# Patient Record
Sex: Female | Born: 1965 | Race: White | Hispanic: No | Marital: Married | Smoking: Never smoker
Health system: Southern US, Community
[De-identification: ages and names within clinical notes are randomized; demographics above are authoritative.]

## PROBLEM LIST (undated history)

## (undated) DIAGNOSIS — R42 Dizziness and giddiness: Secondary | ICD-10-CM

## (undated) DIAGNOSIS — I10 Essential (primary) hypertension: Secondary | ICD-10-CM

## (undated) DIAGNOSIS — K219 Gastro-esophageal reflux disease without esophagitis: Secondary | ICD-10-CM

## (undated) DIAGNOSIS — Z8489 Family history of other specified conditions: Secondary | ICD-10-CM

## (undated) HISTORY — PX: ABDOMINAL HYSTERECTOMY: SHX81

## (undated) HISTORY — PX: BILATERAL OOPHORECTOMY: SHX1221

---

## 2003-12-21 ENCOUNTER — Inpatient Hospital Stay: Payer: Self-pay | Admitting: Obstetrics and Gynecology

## 2004-02-18 ENCOUNTER — Ambulatory Visit: Payer: Self-pay

## 2005-01-30 ENCOUNTER — Ambulatory Visit: Payer: Self-pay | Admitting: Cardiology

## 2006-11-05 ENCOUNTER — Ambulatory Visit: Payer: Self-pay

## 2008-02-29 ENCOUNTER — Ambulatory Visit: Payer: Self-pay | Admitting: Family Medicine

## 2008-03-19 ENCOUNTER — Ambulatory Visit: Payer: Self-pay

## 2009-04-13 ENCOUNTER — Ambulatory Visit: Payer: Self-pay | Admitting: Obstetrics and Gynecology

## 2009-04-19 ENCOUNTER — Ambulatory Visit: Payer: Self-pay | Admitting: Obstetrics and Gynecology

## 2009-05-31 ENCOUNTER — Ambulatory Visit: Payer: Self-pay | Admitting: Obstetrics and Gynecology

## 2009-12-22 ENCOUNTER — Emergency Department: Payer: Self-pay | Admitting: Emergency Medicine

## 2009-12-23 ENCOUNTER — Emergency Department: Payer: Self-pay | Admitting: Emergency Medicine

## 2009-12-26 ENCOUNTER — Emergency Department: Payer: Self-pay | Admitting: Emergency Medicine

## 2009-12-29 ENCOUNTER — Ambulatory Visit: Payer: Self-pay | Admitting: Family Medicine

## 2010-10-12 ENCOUNTER — Ambulatory Visit: Payer: Self-pay | Admitting: Obstetrics and Gynecology

## 2010-10-26 ENCOUNTER — Ambulatory Visit: Payer: Self-pay | Admitting: Obstetrics and Gynecology

## 2011-10-13 ENCOUNTER — Ambulatory Visit: Payer: Self-pay | Admitting: Obstetrics and Gynecology

## 2012-01-12 ENCOUNTER — Emergency Department: Payer: Self-pay | Admitting: Emergency Medicine

## 2012-01-12 LAB — URINALYSIS, COMPLETE
Blood: NEGATIVE
Glucose,UR: NEGATIVE mg/dL (ref 0–75)
Ketone: NEGATIVE
Ph: 6 (ref 4.5–8.0)
Protein: NEGATIVE
RBC,UR: <1 /HPF (ref 0–5)
Specific Gravity: 1.021 (ref 1.003–1.030)
Squamous Epithelial: 1
WBC UR: <1 /HPF (ref 0–5)

## 2012-01-12 LAB — WET PREP, GENITAL

## 2012-01-22 ENCOUNTER — Ambulatory Visit: Payer: Self-pay | Admitting: Obstetrics and Gynecology

## 2012-02-16 ENCOUNTER — Ambulatory Visit: Payer: Self-pay | Admitting: Internal Medicine

## 2012-07-22 ENCOUNTER — Emergency Department: Payer: Self-pay | Admitting: Emergency Medicine

## 2012-07-22 LAB — BASIC METABOLIC PANEL
Anion Gap: 4 — ABNORMAL LOW (ref 7–16)
Calcium, Total: 9.3 mg/dL (ref 8.5–10.1)
Co2: 28 mmol/L (ref 21–32)
EGFR (Non-African Amer.): >60
Glucose: 89 mg/dL (ref 65–99)
Osmolality: 277 (ref 275–301)
Potassium: 3.6 mmol/L (ref 3.5–5.1)

## 2012-07-22 LAB — TROPONIN I
Troponin-I: <0.02 ng/mL
Troponin-I: <0.02 ng/mL

## 2012-07-22 LAB — CBC
HCT: 43.1 % (ref 35.0–47.0)
MCH: 30.3 pg (ref 26.0–34.0)
MCHC: 34.5 g/dL (ref 32.0–36.0)
MCV: 88 fL (ref 80–100)
RBC: 4.91 10*6/uL (ref 3.80–5.20)
RDW: 13.4 % (ref 11.5–14.5)
WBC: 9.7 10*3/uL (ref 3.6–11.0)

## 2012-07-22 LAB — CK TOTAL AND CKMB (NOT AT ARMC)
CK, Total: 66 U/L (ref 21–215)
CK-MB: <0.5 ng/mL — ABNORMAL LOW (ref 0.5–3.6)

## 2012-11-06 ENCOUNTER — Ambulatory Visit: Payer: Self-pay | Admitting: Physician Assistant

## 2013-02-21 ENCOUNTER — Ambulatory Visit: Payer: Self-pay | Admitting: Obstetrics and Gynecology

## 2013-05-06 ENCOUNTER — Ambulatory Visit: Payer: Self-pay | Admitting: Emergency Medicine

## 2013-05-06 LAB — URINALYSIS, COMPLETE
BILIRUBIN, UR: NEGATIVE
Glucose,UR: NEGATIVE mg/dL (ref 0–75)
Ketone: NEGATIVE
Nitrite: POSITIVE
Ph: 6.5 (ref 4.5–8.0)
Protein: 30
Specific Gravity: 1.015 (ref 1.003–1.030)
Squamous Epithelial: NONE SEEN

## 2013-05-09 LAB — URINE CULTURE

## 2013-06-18 ENCOUNTER — Ambulatory Visit: Payer: Self-pay | Admitting: Obstetrics and Gynecology

## 2013-06-18 LAB — CREATININE, SERUM
Creatinine: 0.93 mg/dL (ref 0.60–1.30)
EGFR (African American): >60
EGFR (Non-African Amer.): >60

## 2013-09-29 ENCOUNTER — Ambulatory Visit: Payer: Self-pay | Admitting: Gastroenterology

## 2013-09-30 LAB — PATHOLOGY REPORT

## 2014-04-28 ENCOUNTER — Ambulatory Visit: Payer: Self-pay | Admitting: Obstetrics and Gynecology

## 2014-07-01 IMAGING — CT CT ABDOMEN AND PELVIS WITHOUT AND WITH CONTRAST
2 of 10 series · 11 of 46 positions shown, 18 images · IV contrast (isovue)
Comparison: 01/22/2012

CLINICAL DATA: Painless hematuria. History urinary tract
infections. Dysuria. Partial hysterectomy.

EXAM:
CT ABDOMEN AND PELVIS WITHOUT AND WITH CONTRAST
TECHNIQUE: Multidetector CT imaging of the abdomen and pelvis was performed
without contrast material in one or both body regions, followed by
contrast material(s) and further sections in one or both body
regions.
CONTRAST:  125 cc Isovue 370

[Series 8: hematuria > 45 10 min delay · axial · delayed · 0.69mm/px · z∈[-902,-472]mm · 9 of 108 slices shown, 15 images]
[im 11/108  soft-tissue]
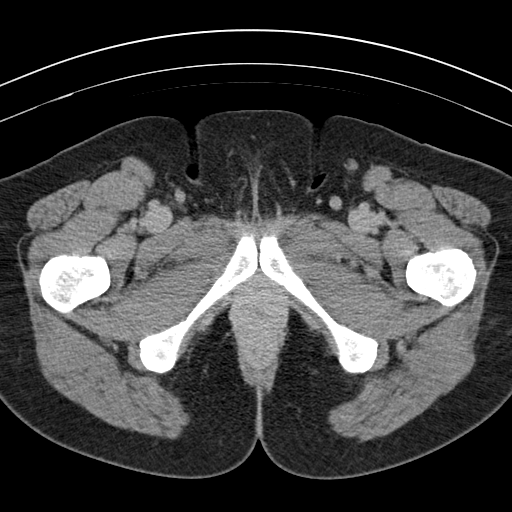
[im 11/108  bone]
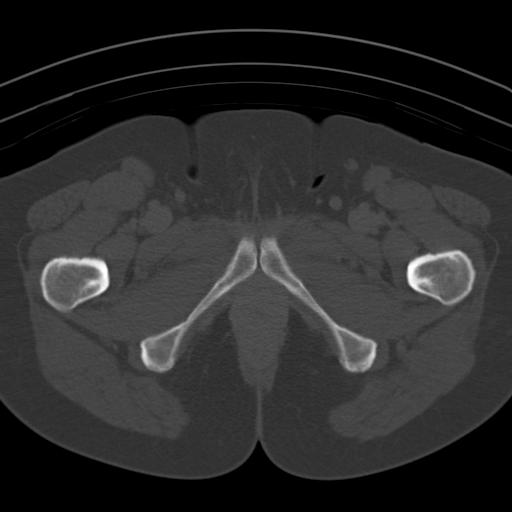
[im 22/108  soft-tissue]
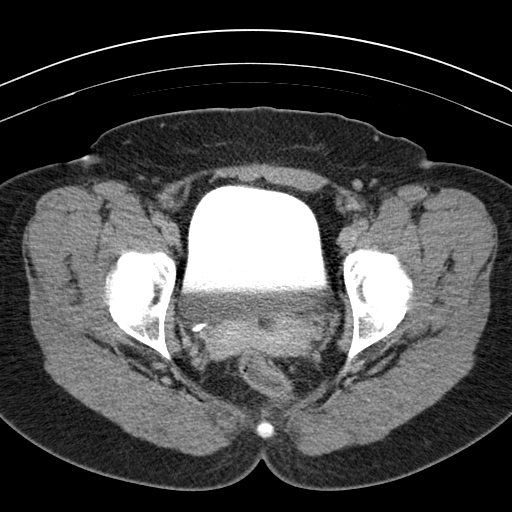
[im 33/108  soft-tissue]
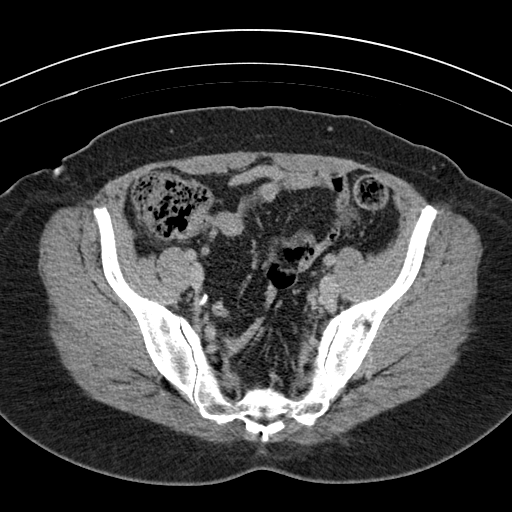
[im 43/108  soft-tissue]
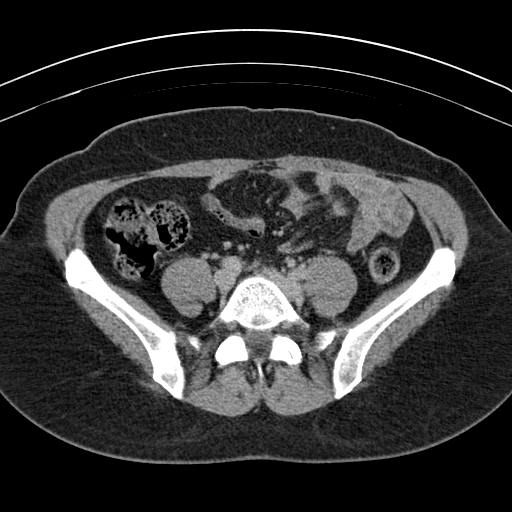
[im 54/108  soft-tissue]
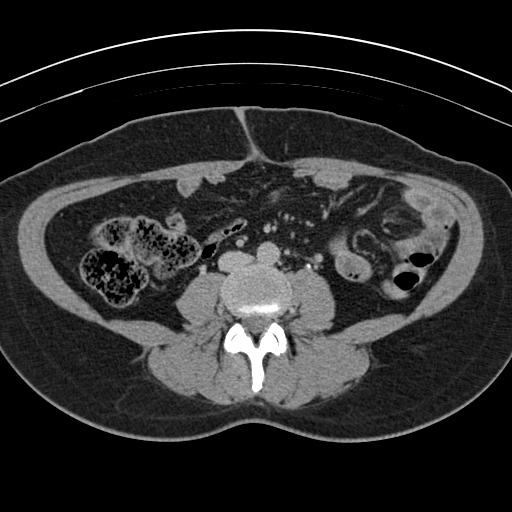
[im 65/108  soft-tissue]
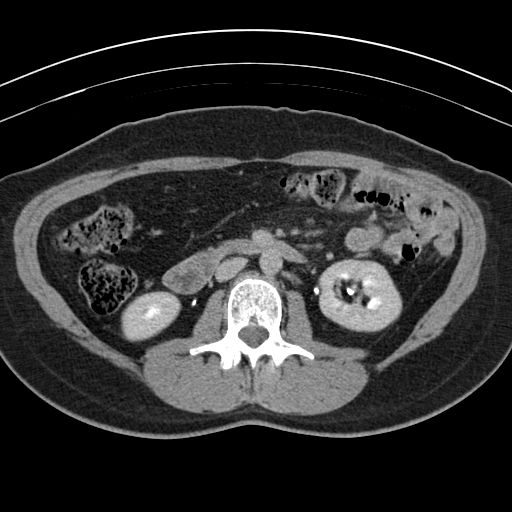
[im 65/108  lung]
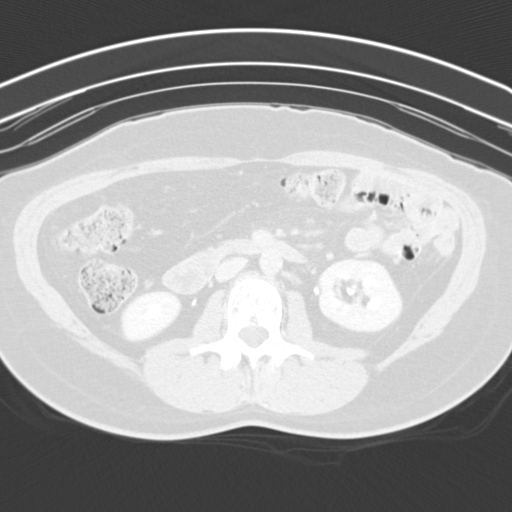
[im 75/108  soft-tissue]
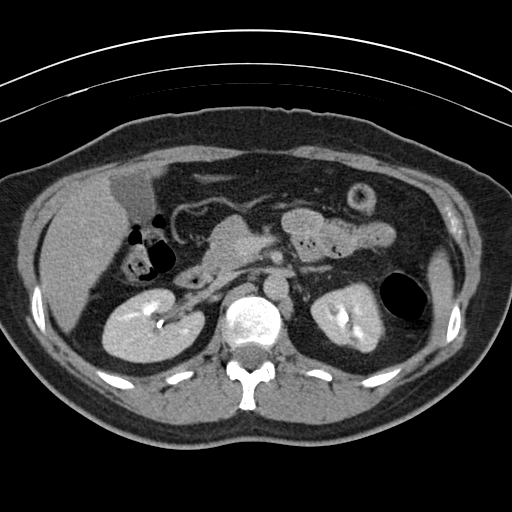
[im 75/108  lung]
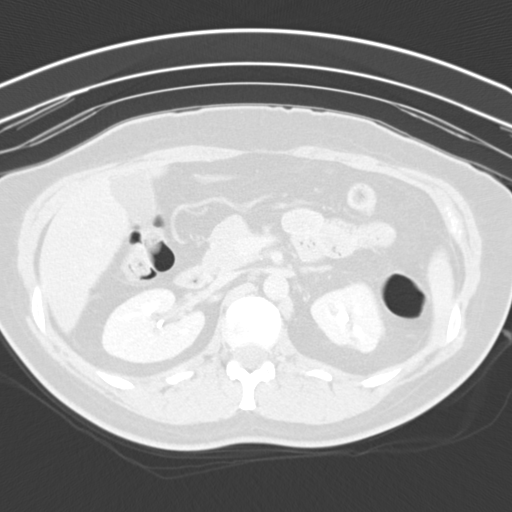
[im 86/108  soft-tissue]
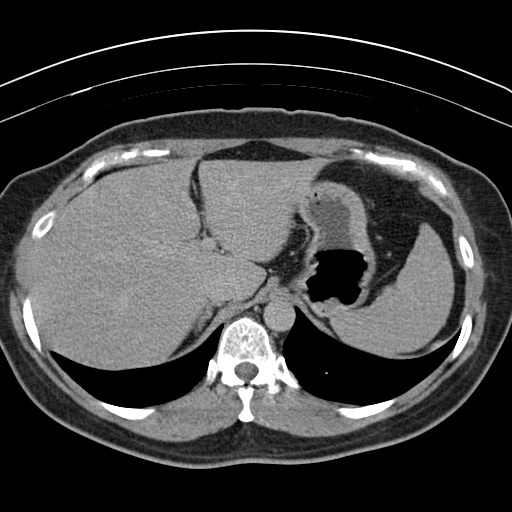
[im 86/108  lung]
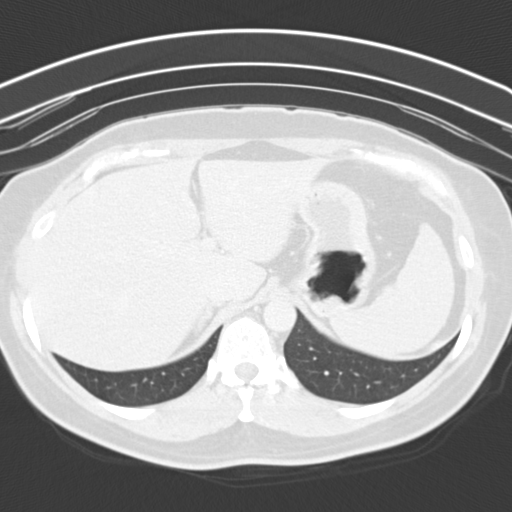
[im 97/108  soft-tissue]
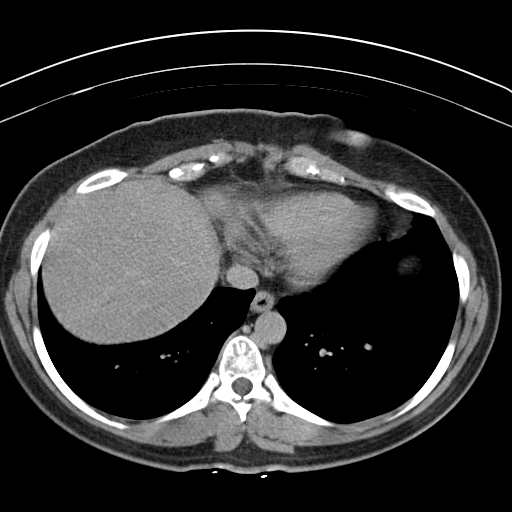
[im 97/108  lung]
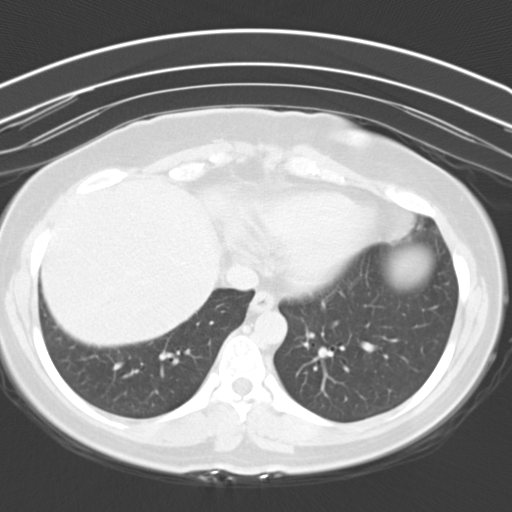
[im 97/108  bone]
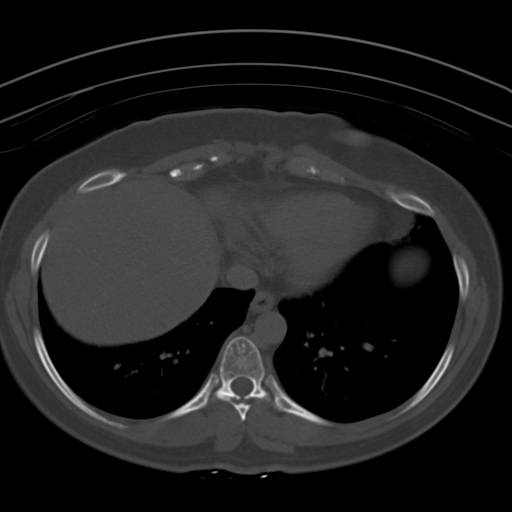

[Series 603: pre cor · coronal · non-contrast · 0.94mm/px · 2 of 100 slices shown, 3 images]
[im 34/100  soft-tissue]
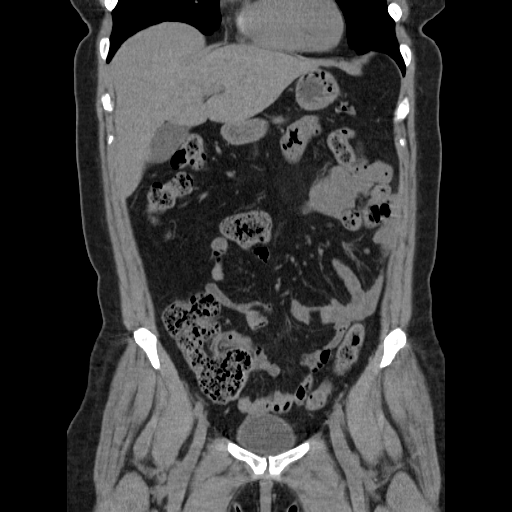
[im 34/100  bone]
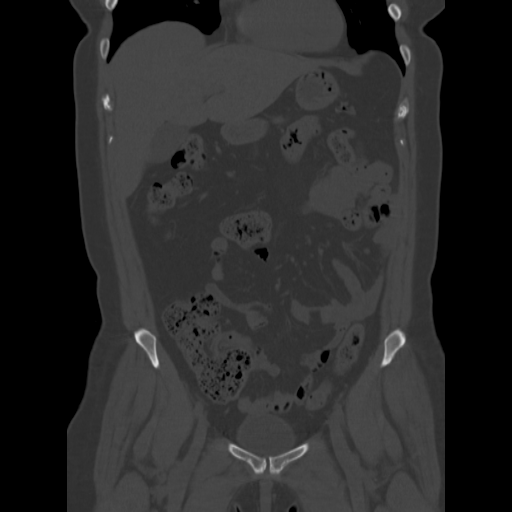
[im 67/100  soft-tissue]
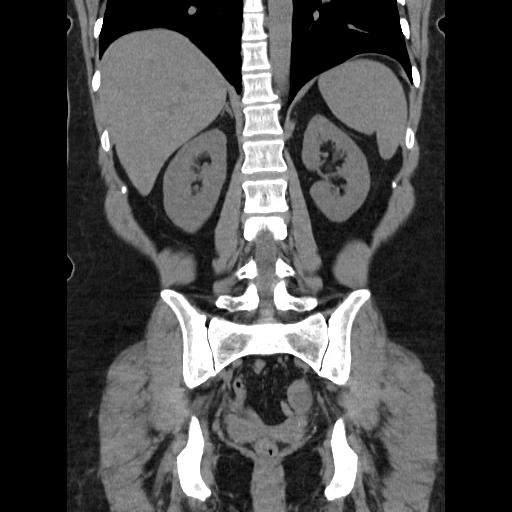

[11 of 46 positions shown; findings below may reference images not displayed]

FINDINGS: Lung Bases: Clear lung bases. Normal heart size without pericardial
or pleural effusion.

Kidneys/Ureters/Bladder: No renal calculi or hydronephrosis.
Phleboliths in the pelvis, but no hydroureter or ureteric stone.

No renal mass on post-contrast images. Mild volume loss and
hypoenhancement in the upper pole left kidney. Example image
25/series 5. Similar. Moderate renal collecting system opacification
on delayed images. Portions of the mid right and mid to distal left
ureters are incompletely opacified. No filling defects within the
opacified portions and no enhancing lesions throughout the ureters.
No enhancing bladder mass or filling defect on moderately well
opacified delayed images.

Other: Normal liver. A tiny splenule. Normal stomach, pancreas,
gallbladder, biliary tract, adrenal glands. Small stable
retroperitoneal nodes, likely reactive. Normal colon, appendix, and
terminal ileum. Normal small bowel without abdominal ascites. No
pelvic adenopathy. Hysterectomy. Left ovarian dominant follicle of
3.0 cm. Right ovary not visualized; no adnexal mass. No significant
free fluid.

Bones/Musculoskeletal: Vague sclerosis in the right iliac is
unchanged, benign but nonspecific.
IMPRESSION: No acute process or explanation for hematuria. Mild scarring in the
upper pole left kidney.

## 2014-07-10 ENCOUNTER — Emergency Department
Admission: EM | Admit: 2014-07-10 | Discharge: 2014-07-10 | Disposition: A | Discharge status: Home / Self Care | Payer: BLUE CROSS/BLUE SHIELD | Attending: Emergency Medicine | Admitting: Emergency Medicine

## 2014-07-10 ENCOUNTER — Encounter: Payer: Self-pay | Admitting: Emergency Medicine

## 2014-07-10 ENCOUNTER — Other Ambulatory Visit: Payer: Self-pay

## 2014-07-10 DIAGNOSIS — R42 Dizziness and giddiness: Secondary | ICD-10-CM | POA: Diagnosis not present

## 2014-07-10 DIAGNOSIS — R5383 Other fatigue: Secondary | ICD-10-CM | POA: Insufficient documentation

## 2014-07-10 HISTORY — DX: Dizziness and giddiness: R42

## 2014-07-10 LAB — BASIC METABOLIC PANEL
Anion gap: 8 (ref 5–15)
BUN: 17 mg/dL (ref 6–20)
CHLORIDE: 101 mmol/L (ref 101–111)
CO2: 31 mmol/L (ref 22–32)
Calcium: 9.4 mg/dL (ref 8.9–10.3)
Creatinine, Ser: 0.99 mg/dL (ref 0.44–1.00)
GFR calc Af Amer: >60 mL/min (ref >60)
GFR calc non Af Amer: >60 mL/min (ref >60)
GLUCOSE: 112 mg/dL — AB (ref 65–99)
Potassium: 3.9 mmol/L (ref 3.5–5.1)
SODIUM: 140 mmol/L (ref 135–145)

## 2014-07-10 LAB — URINALYSIS COMPLETE WITH MICROSCOPIC (ARMC ONLY)
BACTERIA UA: NONE SEEN
Bilirubin Urine: NEGATIVE
GLUCOSE, UA: NEGATIVE mg/dL
Hgb urine dipstick: NEGATIVE
Ketones, ur: NEGATIVE mg/dL
LEUKOCYTES UA: NEGATIVE
NITRITE: NEGATIVE
Protein, ur: NEGATIVE mg/dL
RBC / HPF: NONE SEEN RBC/hpf (ref 0–5)
Specific Gravity, Urine: 1.012 (ref 1.005–1.030)
pH: 8 (ref 5.0–8.0)

## 2014-07-10 LAB — TROPONIN I

## 2014-07-10 LAB — CBC
HEMATOCRIT: 42.9 % (ref 35.0–47.0)
Hemoglobin: 14.3 g/dL (ref 12.0–16.0)
MCH: 29.3 pg (ref 26.0–34.0)
MCHC: 33.2 g/dL (ref 32.0–36.0)
MCV: 88.3 fL (ref 80.0–100.0)
Platelets: 262 10*3/uL (ref 150–440)
RBC: 4.86 MIL/uL (ref 3.80–5.20)
RDW: 13 % (ref 11.5–14.5)
WBC: 6.9 10*3/uL (ref 3.6–11.0)

## 2014-07-10 MED ORDER — MECLIZINE HCL 25 MG PO TABS
25.0000 mg | ORAL_TABLET | Freq: Once | ORAL | Status: AC
  Administered 2014-07-10: 25 mg via ORAL

## 2014-07-10 MED ORDER — MECLIZINE HCL 25 MG PO TABS
ORAL_TABLET | ORAL | Status: AC
  Administered 2014-07-10: 25 mg via ORAL
  Filled 2014-07-10: qty 1

## 2014-07-10 MED ORDER — MECLIZINE HCL 12.5 MG PO TABS: 12.5000 mg | ORAL_TABLET | Freq: Three times a day (TID) | ORAL | Status: AC | PRN

## 2014-07-10 NOTE — ED Notes (Signed)
Patient to ED with c/o feeling like her vertigo is causing her dizziness since yesterday evening. Patient reports however that this time she has noted herself to be more weak over all and feeling fatigued. Patient also reports that over the last couple of months she feels like she is having more trouble getting her thoughts out into words. Reports normally she can take a Valium and will go to bed and wake up and it's better. Patient c/o nausea when lying down for EKG.

## 2014-07-10 NOTE — ED Notes (Signed)
Pt in no distress, advised pt of wait, pt verbalized understanding, lab results reviewed

## 2014-07-10 NOTE — ED Notes (Signed)
Pt arrives with complaints of dizziness, vertigo, and equilibrium off, pt states she feels slow to catch on to conversations, pt states she feels like she is in a fog, pt states symptoms for about a week, pt awake and alert during assessment, answering appropriate questions

## 2014-07-10 NOTE — Discharge Instructions (Signed)
Take meclizine as needed. Follow-up with your doctor at [facility] clinic or with neurology as discussed. Return to the emergency department if you feel worse or you have other urgent concerns.  Benign Positional Vertigo Vertigo means you feel like you or your surroundings are moving when they are not. Benign positional vertigo is the most common form of vertigo. Benign means that the cause of your condition is not serious. Benign positional vertigo is more common in older adults. CAUSES  Benign positional vertigo is the result of an upset in the labyrinth system. This is an area in the middle ear that helps control your balance. This may be caused by a viral infection, head injury, or repetitive motion. However, often no specific cause is found. SYMPTOMS  Symptoms of benign positional vertigo occur when you move your head or eyes in different directions. Some of the symptoms may include:  Loss of balance and falls.  Vomiting.  Blurred vision.  Dizziness.  Nausea.  Involuntary eye movements (nystagmus). DIAGNOSIS  Benign positional vertigo is usually diagnosed by physical exam. If the specific cause of your benign positional vertigo is unknown, your caregiver may perform imaging tests, such as magnetic resonance imaging (MRI) or computed tomography (CT). TREATMENT  Your caregiver may recommend movements or procedures to correct the benign positional vertigo. Medicines such as meclizine, benzodiazepines, and medicines for nausea may be used to treat your symptoms. In rare cases, if your symptoms are caused by certain conditions that affect the inner ear, you may need surgery. HOME CARE INSTRUCTIONS   Follow your caregiver's instructions.  Move slowly. Do not make sudden body or head movements.  Avoid driving.  Avoid operating heavy machinery.  Avoid performing any tasks that would be dangerous to you or others during a vertigo episode.  Drink enough fluids to keep your urine clear or  pale yellow. SEEK IMMEDIATE MEDICAL CARE IF:   You develop problems with walking, weakness, numbness, or using your arms, hands, or legs.  You have difficulty speaking.  You develop severe headaches.  Your nausea or vomiting continues or gets worse.  You develop visual changes.  Your family or friends notice any behavioral changes.  Your condition gets worse.  You have a fever.  You develop a stiff neck or sensitivity to light. MAKE SURE YOU:   Understand these instructions.  Will watch your condition.  Will get help right away if you are not doing well or get worse. Document Released: 11/14/2005 Document Revised: 05/01/2011 Document Reviewed: 10/27/2010 [facility] Patient Information 2015 [place], [place]. This information is not intended to replace advice given to you by your health care provider. Make sure you discuss any questions you have with your health care provider.

## 2014-07-10 NOTE — ED Provider Notes (Signed)
[facility] Emergency Department Provider Note  ____________________________________________  Time seen: 1530  I have reviewed the triage vital signs and the nursing notes.   HISTORY  Chief Complaint Dizziness and Fatigue     HPI Kelsey Stephenson is a 49 y.o. female who has a history of vertigo. She reports that she gets this 2-3 times a year. She began to have symptoms of vertigo last night. It continued today, although it is much better now. She reports that she was more concerned because she feels like she has been in a "fog" for the past week. She feels as though she misses words within conversations. She does not feel like her usual self. She does report a mild headache. She has no chest pain, shortness of breath, abdominal pain, nausea or vomiting.   Past Medical History  Diagnosis Date  . Vertigo     There are no active problems to display for this patient.   History reviewed. No pertinent past surgical history.  Current Outpatient Rx  Name  Route  Sig  Dispense  Refill  . meclizine (ANTIVERT) 12.5 MG tablet   Oral   Take 1 tablet (12.5 mg total) by mouth 3 (three) times daily as needed for dizziness or nausea.   20 tablet   1     Allergies Review of patient's allergies indicates no known allergies.  History reviewed. No pertinent family history.  Social History History  Substance Use Topics  . Smoking status: Never Smoker   . Smokeless tobacco: Never Used  . Alcohol Use: No    Review of Systems  Constitutional: Negative for fever. ENT: Negative for sore throat. Cardiovascular: Negative for chest pain. Respiratory: Negative for shortness of breath. Gastrointestinal: Negative for abdominal pain, vomiting and diarrhea. Genitourinary: Negative for dysuria. Musculoskeletal: Negative for back pain. Skin: Negative for rash. Neurological: Positive for a history of vertigo and recent vertigo as well as for a mild headache. See  history of present illness.   10-point ROS otherwise negative.  ____________________________________________   PHYSICAL EXAM:  VITAL SIGNS: ED Triage Vitals  Enc Vitals Group     BP 07/10/14 1248 129/88 mmHg     Pulse Rate 07/10/14 1248 82     Resp 07/10/14 1248 18     Temp 07/10/14 1248 98.7 F (37.1 C)     Temp src --      SpO2 07/10/14 1248 99 %     Weight 07/10/14 1248 178 lb (80.74 kg)     Height 07/10/14 1248 5\' 7"  (1.702 m)     Head Cir --      Peak Flow --      Pain Score --      Pain Loc --      Pain Edu? --      Excl. in [place]? --    Constitutional: Alert and oriented. Well appearing and in no distress. ENT   Head: Normocephalic and atraumatic.   Nose: No congestion/rhinnorhea.   Mouth/Throat: Mucous membranes are moist.      Ears: Normal to exam. No erythema. Normal canals. No bulging of the TMs. No pain on exam. Cardiovascular: Normal rate, regular rhythm. Respiratory: Normal respiratory effort without tachypnea. Breath sounds are clear and equal bilaterally. No wheezes/rales/rhonchi. Gastrointestinal: Soft and nontender. No distention.  Back: There is no CVA tenderness. Musculoskeletal: Nontender with normal range of motion in all extremities.  No noted edema. Neurologic: No focal neurologic deficits. She is ambulatory without abnormalities. She has  5 over 5 strength in all 4 extremities. She has a negative Romberg, negative pronator drift, negative finger to nose. Cranial nerves II through XII are intact..  Skin:  Skin is warm, dry. No rash noted. Psychiatric: Mood and affect are normal. Speech and behavior are normal.  ____________________________________________    LABS (pertinent positives/negatives)  Blood tests are overall unremarkable with a normal blood count, normal metabolic panel, and a negative troponin level. A UA is pending  ----------------------------------------- 4:55 PM on  07/10/2014 -----------------------------------------  Urinalysis is within normal limits with no sign of infection.  ____________________________________________   EKG  I have interpreted and reviewed the EKG myself. It shows normal sinus rhythm at a rate of 79 with a normal access, normal intervals, and no ST-T segment changes. This EKG was done at 12:52 PM.  ____________________________________________   ________________ INITIAL IMPRESSION / ASSESSMENT AND PLAN / ED COURSE  This patient is alert communicative and mentating properly. She has symptoms of vertigo that she has had before. I have discussed the pros and cons of doing a CT scan for this patient who has a mild headache and prior episodes of vertigo. She agrees that a CT is not appropriate today. I believe she needs follow-up with her primary physician or with a neurologist. Given the long-term nature of this problem and MRI after outpatient neurologic consult would not be unreasonable.  We will treat her with meclizine.  ____________________________________________   FINAL CLINICAL IMPRESSION(S) / ED DIAGNOSES  Final diagnoses:  Vertigo      Ahmed Prima, MD 07/10/14 [phone]

## 2015-09-28 ENCOUNTER — Emergency Department
Admission: EM | Admit: 2015-09-28 | Discharge: 2015-09-28 | Disposition: A | Discharge status: Home / Self Care | Payer: BLUE CROSS/BLUE SHIELD | Attending: Emergency Medicine | Admitting: Emergency Medicine

## 2015-09-28 ENCOUNTER — Encounter: Payer: Self-pay | Admitting: Emergency Medicine

## 2015-09-28 DIAGNOSIS — R55 Syncope and collapse: Secondary | ICD-10-CM | POA: Insufficient documentation

## 2015-09-28 DIAGNOSIS — F410 Panic disorder [episodic paroxysmal anxiety] without agoraphobia: Secondary | ICD-10-CM | POA: Diagnosis not present

## 2015-09-28 LAB — URINALYSIS COMPLETE WITH MICROSCOPIC (ARMC ONLY)
BACTERIA UA: NONE SEEN
Bilirubin Urine: NEGATIVE
Glucose, UA: NEGATIVE mg/dL
Hgb urine dipstick: NEGATIVE
Ketones, ur: NEGATIVE mg/dL
LEUKOCYTES UA: NEGATIVE
Nitrite: NEGATIVE
PROTEIN: NEGATIVE mg/dL
Specific Gravity, Urine: 1.01 (ref 1.005–1.030)
pH: 6 (ref 5.0–8.0)

## 2015-09-28 LAB — BASIC METABOLIC PANEL
Anion gap: 11 (ref 5–15)
BUN: 25 mg/dL — ABNORMAL HIGH (ref 6–20)
CHLORIDE: 103 mmol/L (ref 101–111)
CO2: 26 mmol/L (ref 22–32)
CREATININE: 0.95 mg/dL (ref 0.44–1.00)
Calcium: 9.3 mg/dL (ref 8.9–10.3)
GFR calc Af Amer: >60 mL/min (ref >60)
GFR calc non Af Amer: >60 mL/min (ref >60)
Glucose, Bld: 110 mg/dL — ABNORMAL HIGH (ref 65–99)
POTASSIUM: 3.9 mmol/L (ref 3.5–5.1)
SODIUM: 140 mmol/L (ref 135–145)

## 2015-09-28 LAB — CBC
HEMATOCRIT: 40.6 % (ref 35.0–47.0)
Hemoglobin: 14.3 g/dL (ref 12.0–16.0)
MCH: 30.1 pg (ref 26.0–34.0)
MCHC: 35.1 g/dL (ref 32.0–36.0)
MCV: 85.7 fL (ref 80.0–100.0)
Platelets: 271 10*3/uL (ref 150–440)
RBC: 4.74 MIL/uL (ref 3.80–5.20)
RDW: 13.5 % (ref 11.5–14.5)
WBC: 8.9 10*3/uL (ref 3.6–11.0)

## 2015-09-28 LAB — TROPONIN I: Troponin I: <0.03 ng/mL (ref <0.03)

## 2015-09-28 NOTE — ED Provider Notes (Signed)
[facility] Emergency Department Provider Note  ____________________________________________   First MD Initiated Contact with Patient 09/28/15 [phone]     (approximate)  I have reviewed the triage vital signs and the nursing notes.   HISTORY  Chief Complaint Near Syncope    HPI Kelsey Stephenson is a 50 y.o. female with a past medical history that includes vertigo and panic attacks although panic attacks are rare.  She presents tonight for evaluation ofa constellation of symptoms that she felt before trying to go to sleep tonight that included tingling, feeling like she was given a pass out, some rapid breathing and possibly palpitations.  She states that it does feel similar to prior panic attacks but what was different this time was that she took her blood pressure during the episode and found her blood pressure significantly elevated in the 180/110 range.  She repeated the blood pressure measurement several times and it was always elevated so she felt like she should be checked out in the emergency department.  She states that the symptoms started rapidly, were moderate to severe in intensity, and completely resolved without intervention.  She did take a Xanax at home but it did not immediately help.  She denies fever/chills, chest pain, shortness of breath, abdominal pain, nausea, vomiting, dysuria.  She does not have a diagnosis of hypertension, hypercholesterolemia, diabetes, and she does not smoke.  She has no first-degree relatives that have any history of heart disease.   Past Medical History:  Diagnosis Date  . Vertigo     There are no active problems to display for this patient.   Past Surgical History:  Procedure Laterality Date  . ABDOMINAL HYSTERECTOMY      Prior to Admission medications   Not on File    Allergies Review of patient's allergies indicates no known allergies.  No family history on file.  Social History Social History    Substance Use Topics  . Smoking status: Never Smoker  . Smokeless tobacco: Never Used  . Alcohol use No    Review of Systems Constitutional: No fever/chills Eyes: No visual changes. ENT: No sore throat. Cardiovascular: Denies chest pain. +palpitations and "tingling" of her chest Respiratory: Denies shortness of breath. Gastrointestinal: No abdominal pain.  No nausea, no vomiting.  No diarrhea.  No constipation. Genitourinary: Negative for dysuria. Musculoskeletal: Negative for back pain. Skin: Negative for rash. Neurological: Negative for headaches, focal weakness or numbness except for some tingling in her fingers during her episode.  Near syncope  10-point ROS otherwise negative.  ____________________________________________   PHYSICAL EXAM:  VITAL SIGNS: ED Triage Vitals  Enc Vitals Group     BP 09/28/15 0142 (!) 147/88     Pulse Rate 09/28/15 0142 88     Resp 09/28/15 0142 20     Temp 09/28/15 0142 98 F (36.7 C)     Temp Source 09/28/15 0142 Oral     SpO2 09/28/15 0142 99 %     Weight 09/28/15 0142 190 lb (86.2 kg)     Height 09/28/15 0142 5\' 7"  (1.702 m)     Head Circumference --      Peak Flow --      Pain Score 09/28/15 0446 0     Pain Loc --      Pain Edu? --      Excl. in [place]? --     Constitutional: Alert and oriented. Well appearing and in no acute distress. Eyes: Conjunctivae are normal. PERRL. EOMI.  Head: Atraumatic. Nose: No congestion/rhinnorhea. Mouth/Throat: Mucous membranes are moist.  Oropharynx non-erythematous. Neck: No stridor.  No meningeal signs.   Cardiovascular: Normal rate, regular rhythm. Good peripheral circulation. Grossly normal heart sounds.   Respiratory: Normal respiratory effort.  No retractions. Lungs CTAB. Gastrointestinal: Soft and nontender. No distention.  Musculoskeletal: No lower extremity tenderness nor edema. No gross deformities of extremities. Neurologic:  Normal speech and language. No gross focal neurologic  deficits are appreciated.  Skin:  Skin is warm, dry and intact. No rash noted. Psychiatric: Mood and affect are normal. Speech and behavior are normal.  ____________________________________________   LABS (all labs ordered are listed, but only abnormal results are displayed)  Labs Reviewed  BASIC METABOLIC PANEL - Abnormal; Notable for the following:       Result Value   Glucose, Bld 110 (*)    BUN 25 (*)    All other components within normal limits  URINALYSIS COMPLETEWITH MICROSCOPIC ([facility] ONLY) - Abnormal; Notable for the following:    Color, Urine STRAW (*)    APPearance CLEAR (*)    Squamous Epithelial / LPF 0-5 (*)    All other components within normal limits  CBC  TROPONIN I   ____________________________________________  EKG  ED ECG REPORT I, Adrieana Fennelly, the attending physician, personally viewed and interpreted this ECG.  Date: 09/28/2015 EKG Time: 01:49 Rate: 89 Rhythm: normal sinus rhythm QRS Axis: normal Intervals: normal ST/T Wave abnormalities: normal Conduction Disturbances: none Narrative Interpretation: unremarkable  ____________________________________________  RADIOLOGY   No results found.  ____________________________________________   PROCEDURES  Procedure(s) performed:   Procedures   Critical Care performed: No ____________________________________________   INITIAL IMPRESSION / ASSESSMENT AND PLAN / ED COURSE  Pertinent labs & imaging results that were available during my care of the patient were reviewed by me and considered in my medical decision making (see chart for details).  Well-appearing and in no acute distress with normal vitals and normal lab workup.      Pulmonary Embolism Rule-out Criteria (PERC rule)                        If YES to ANY of the following, the PERC rule is not satisfied and cannot be used to rule out PE in this patient (consider d-dimer or imaging depending on pre-test probability).                       If NO to ALL of the following, AND the clinician's pre-test probability is <15%, the [facility] rule is satisfied and there is no need for further workup (including no need to obtain a d-dimer) as the post-test probability of pulmonary embolism is <2%.                      Mnemonic is HAD CLOTS   H - hormone use (exogenous estrogen)      No. A - age > 86                                                 No. D - DVT/PE history  No.   C - coughing blood (hemoptysis)                 No. L - leg swelling, unilateral                             No. O - O2 Sat on Room Air < 95%                  No. T - tachycardia (HR ? 100)                         No. S - surgery or trauma, recent                      No.   Based on my evaluation of the patient, including application of this decision instrument, further testing to evaluate for pulmonary embolism is not indicated at this time.   HEART score is 1.  Suspect panic attack, now resolved.  I had my usual and customary discussion with the patient and her husband and gave my usual return precautions.  She will follow up as an outpatient with her primary care doctor.    ____________________________________________  FINAL CLINICAL IMPRESSION(S) / ED DIAGNOSES  Final diagnoses:  Near syncope  Panic attack     MEDICATIONS GIVEN DURING THIS VISIT:  Medications - No data to display   NEW OUTPATIENT MEDICATIONS STARTED DURING THIS VISIT:  New Prescriptions   No medications on file      Note:  This document was prepared using Dragon voice recognition software and may include unintentional dictation errors.    Hinda Kehr, MD 09/28/15 [phone]

## 2015-09-28 NOTE — ED Notes (Signed)
Pt states her BP was elevated when her s/x's initially started prior to being transported to the ED via [facility]. Pt reports BP of 188/110 at home when she felt the tingling sensations and near-syncopal episode that caused her to come to the ED for evaluation. Pt denies any shortness of breath or nausea, but (+) for "indigestion". Pt states all presenting s/x's have since subsided.

## 2015-09-28 NOTE — ED Triage Notes (Addendum)
Pt to triage via w/c with no distress noted, brought in by EMS; pt reports sudden onset tingling sensation to chest, felt "like going to pass out"; took xanax for possible anxiety with no relief; denies any c/o at present

## 2015-09-28 NOTE — Discharge Instructions (Signed)
As we discussed, your workup today was reassuring.  Though we do not know exactly what is causing your symptoms, it appears that you have no emergent medical condition at this time are safe to go home and follow up as recommended in this paperwork.  We recommend you take a daily baby aspirin (81 mg) if you are not already taking one.  Please return immediately to the Emergency Department if you develop any new or worsening symptoms that concern you.

## 2015-09-28 NOTE — ED Notes (Signed)
Ems via wheelchair, anxiety related symptoms , no chest pain , BP 188/102, repeat 142/92, took own xanax PTA , no signs of distress, CBG 97 , RA 97%

## 2015-11-05 ENCOUNTER — Other Ambulatory Visit: Payer: Self-pay | Admitting: Obstetrics and Gynecology

## 2015-11-05 DIAGNOSIS — Z1231 Encounter for screening mammogram for malignant neoplasm of breast: Secondary | ICD-10-CM

## 2015-11-18 ENCOUNTER — Ambulatory Visit
Admission: RE | Admit: 2015-11-18 | Discharge: 2015-11-18 | Disposition: A | Discharge status: Home / Self Care | Payer: BLUE CROSS/BLUE SHIELD | Source: Ambulatory Visit | Attending: Obstetrics and Gynecology | Admitting: Obstetrics and Gynecology

## 2015-11-18 ENCOUNTER — Other Ambulatory Visit: Payer: Self-pay | Admitting: Obstetrics and Gynecology

## 2015-11-18 DIAGNOSIS — Z1231 Encounter for screening mammogram for malignant neoplasm of breast: Secondary | ICD-10-CM

## 2016-10-31 ENCOUNTER — Other Ambulatory Visit: Payer: Self-pay | Admitting: Podiatry

## 2016-10-31 DIAGNOSIS — R609 Edema, unspecified: Secondary | ICD-10-CM

## 2016-10-31 DIAGNOSIS — M79672 Pain in left foot: Secondary | ICD-10-CM

## 2016-11-08 ENCOUNTER — Ambulatory Visit
Admission: RE | Admit: 2016-11-08 | Discharge: 2016-11-08 | Disposition: A | Discharge status: Home / Self Care | Payer: BLUE CROSS/BLUE SHIELD | Source: Ambulatory Visit | Attending: Podiatry | Admitting: Podiatry

## 2016-11-08 DIAGNOSIS — R609 Edema, unspecified: Secondary | ICD-10-CM | POA: Diagnosis not present

## 2016-11-08 DIAGNOSIS — M19072 Primary osteoarthritis, left ankle and foot: Secondary | ICD-10-CM | POA: Insufficient documentation

## 2016-11-08 DIAGNOSIS — M79672 Pain in left foot: Secondary | ICD-10-CM | POA: Diagnosis present

## 2016-11-08 DIAGNOSIS — M7742 Metatarsalgia, left foot: Secondary | ICD-10-CM | POA: Insufficient documentation

## 2016-11-21 ENCOUNTER — Other Ambulatory Visit: Payer: Self-pay | Admitting: Podiatry

## 2016-11-27 ENCOUNTER — Other Ambulatory Visit: Payer: Self-pay | Admitting: Nurse Practitioner

## 2016-11-27 DIAGNOSIS — Z1231 Encounter for screening mammogram for malignant neoplasm of breast: Secondary | ICD-10-CM

## 2016-11-28 ENCOUNTER — Other Ambulatory Visit: Payer: Self-pay | Admitting: Neurology

## 2016-11-28 DIAGNOSIS — R42 Dizziness and giddiness: Secondary | ICD-10-CM

## 2016-11-29 ENCOUNTER — Encounter: Payer: Self-pay | Admitting: *Deleted

## 2016-12-01 ENCOUNTER — Ambulatory Visit
Admission: RE | Admit: 2016-12-01 | Discharge: 2016-12-01 | Disposition: A | Discharge status: Home / Self Care | Payer: BLUE CROSS/BLUE SHIELD | Source: Ambulatory Visit | Attending: Nurse Practitioner | Admitting: Nurse Practitioner

## 2016-12-01 DIAGNOSIS — Z1231 Encounter for screening mammogram for malignant neoplasm of breast: Secondary | ICD-10-CM | POA: Diagnosis present

## 2016-12-01 NOTE — Discharge Instructions (Signed)
[facility] °[facility] ° °POST OPERATIVE INSTRUCTIONS FOR DR. TROXLER AND DR. FOWLER °[facility] ° ° °1. Take your medication as prescribed.  Pain medication should be taken only as needed. ° °2. Keep the dressing clean, dry and intact. ° °3. Keep your foot elevated above the heart level for the first 48 hours. ° °4. Walking to the bathroom and brief periods of walking are acceptable, unless we have instructed you to be non-weight bearing. ° °5. Always wear your post-op shoe when walking.  Always use your crutches if you are to be non-weight bearing. ° °6. Do not take a shower. Baths are permissible as long as the foot is kept out of the water.  ° °7. Every hour you are awake:  °- Bend your knee 15 times. °- Flex foot 15 times °- Massage calf 15 times ° °8. Call [facility] ([phone]) if any of the following problems occur: °- You develop a temperature or fever. °- The bandage becomes saturated with blood. °- Medication does not stop your pain. °- Injury of the foot occurs. °- Any symptoms of infection including redness, odor, or red streaks running from wound. ° ° °General Anesthesia, Adult, Care After °These instructions provide you with information about caring for yourself after your procedure. Your health care provider may also give you more specific instructions. Your treatment has been planned according to current medical practices, but problems sometimes occur. Call your health care provider if you have any problems or questions after your procedure. °What can I expect after the procedure? °After the procedure, it is common to have: °· Vomiting. °· A sore throat. °· Mental slowness. ° °It is common to feel: °· Nauseous. °· Cold or shivery. °· Sleepy. °· Tired. °· Sore or achy, even in parts of your body where you did not have surgery. ° °Follow these instructions at home: °For at least 24 hours after the procedure: °· Do not: °? Participate in  activities where you could fall or become injured. °? Drive. °? Use heavy machinery. °? Drink alcohol. °? Take sleeping pills or medicines that cause drowsiness. °? Make important decisions or sign legal documents. °? Take care of children on your own. °· Rest. °Eating and drinking °· If you vomit, drink water, juice, or soup when you can drink without vomiting. °· Drink enough fluid to keep your urine clear or pale yellow. °· Make sure you have little or no nausea before eating solid foods. °· Follow the diet recommended by your health care provider. °General instructions °· Have a responsible adult stay with you until you are awake and alert. °· Return to your normal activities as told by your health care provider. Ask your health care provider what activities are safe for you. °· Take over-the-counter and prescription medicines only as told by your health care provider. °· If you smoke, do not smoke without supervision. °· Keep all follow-up visits as told by your health care provider. This is important. °Contact a health care provider if: °· You continue to have nausea or vomiting at home, and medicines are not helpful. °· You cannot drink fluids or start eating again. °· You cannot urinate after 8-12 hours. °· You develop a skin rash. °· You have fever. °· You have increasing redness at the site of your procedure. °Get help right away if: °· You have difficulty breathing. °· You have chest pain. °· You have unexpected bleeding. °· You feel that you   are having a life-threatening or urgent problem. °This information is not intended to replace advice given to you by your health care provider. Make sure you discuss any questions you have with your health care provider. °Document Released: 05/15/2000 Document Revised: 07/12/2015 Document Reviewed: 01/21/2015 °Elsevier Interactive Patient Education © 2018 [employer] ° °

## 2016-12-05 ENCOUNTER — Other Ambulatory Visit
Admission: RE | Admit: 2016-12-05 | Discharge: 2016-12-05 | Disposition: A | Discharge status: Home / Self Care | Payer: BLUE CROSS/BLUE SHIELD | Source: Ambulatory Visit | Attending: Neurology | Admitting: Neurology

## 2016-12-05 ENCOUNTER — Ambulatory Visit
Admission: RE | Admit: 2016-12-05 | Discharge: 2016-12-05 | Disposition: A | Discharge status: Home / Self Care | Payer: BLUE CROSS/BLUE SHIELD | Source: Ambulatory Visit | Attending: Neurology | Admitting: Neurology

## 2016-12-05 DIAGNOSIS — R42 Dizziness and giddiness: Secondary | ICD-10-CM | POA: Insufficient documentation

## 2016-12-05 LAB — COMPREHENSIVE METABOLIC PANEL
ALBUMIN: 4.3 g/dL (ref 3.5–5.0)
ALK PHOS: 63 U/L (ref 38–126)
ALT: 25 U/L (ref 14–54)
ANION GAP: 7 (ref 5–15)
AST: 22 U/L (ref 15–41)
BUN: 17 mg/dL (ref 6–20)
CALCIUM: 9.2 mg/dL (ref 8.9–10.3)
CO2: 30 mmol/L (ref 22–32)
Chloride: 102 mmol/L (ref 101–111)
Creatinine, Ser: 0.85 mg/dL (ref 0.44–1.00)
GFR calc non Af Amer: >60 mL/min (ref >60)
GLUCOSE: 85 mg/dL (ref 65–99)
POTASSIUM: 4.1 mmol/L (ref 3.5–5.1)
SODIUM: 139 mmol/L (ref 135–145)
TOTAL PROTEIN: 7.1 g/dL (ref 6.5–8.1)
Total Bilirubin: 0.6 mg/dL (ref 0.3–1.2)

## 2016-12-05 MED ORDER — GADOBENATE DIMEGLUMINE 529 MG/ML IV SOLN
18.0000 mL | Freq: Once | INTRAVENOUS | Status: AC | PRN
  Administered 2016-12-05: 18 mL via INTRAVENOUS

## 2016-12-06 ENCOUNTER — Encounter
Admission: RE | Disposition: A | Discharge status: Home / Self Care | Payer: Self-pay | Source: Ambulatory Visit | Attending: Podiatry

## 2016-12-06 ENCOUNTER — Ambulatory Visit: Payer: BLUE CROSS/BLUE SHIELD | Admitting: Student in an Organized Health Care Education/Training Program

## 2016-12-06 ENCOUNTER — Ambulatory Visit
Admission: RE | Admit: 2016-12-06 | Discharge: 2016-12-06 | Disposition: A | Discharge status: Home / Self Care | Payer: BLUE CROSS/BLUE SHIELD | Source: Ambulatory Visit | Attending: Podiatry | Admitting: Podiatry

## 2016-12-06 DIAGNOSIS — K219 Gastro-esophageal reflux disease without esophagitis: Secondary | ICD-10-CM | POA: Insufficient documentation

## 2016-12-06 DIAGNOSIS — I10 Essential (primary) hypertension: Secondary | ICD-10-CM | POA: Diagnosis not present

## 2016-12-06 DIAGNOSIS — M659 Synovitis and tenosynovitis, unspecified: Secondary | ICD-10-CM | POA: Diagnosis present

## 2016-12-06 DIAGNOSIS — G5762 Lesion of plantar nerve, left lower limb: Secondary | ICD-10-CM | POA: Insufficient documentation

## 2016-12-06 HISTORY — PX: CORRECTION OVERLAPPING TOES: SHX6615

## 2016-12-06 HISTORY — DX: Gastro-esophageal reflux disease without esophagitis: K21.9

## 2016-12-06 HISTORY — DX: Essential (primary) hypertension: I10

## 2016-12-06 HISTORY — DX: Family history of other specified conditions: Z84.89

## 2016-12-06 HISTORY — PX: EXCISION MORTON'S NEUROMA: SHX5013

## 2016-12-06 SURGERY — EXCISION, MORTON'S NEUROMA
Anesthesia: General | Laterality: Left

## 2016-12-06 MED ORDER — MEPERIDINE HCL 25 MG/ML IJ SOLN: 6.2500 mg | INTRAMUSCULAR | Status: DC | PRN

## 2016-12-06 MED ORDER — LACTATED RINGERS IV SOLN
10.0000 mL/h | INTRAVENOUS | Status: DC
  Administered 2016-12-06: 10 mL/h via INTRAVENOUS

## 2016-12-06 MED ORDER — EPHEDRINE SULFATE 50 MG/ML IJ SOLN
INTRAMUSCULAR | Status: DC | PRN
  Administered 2016-12-06 (×5): 5 mg via INTRAVENOUS
  Administered 2016-12-06: 10 mg via INTRAVENOUS

## 2016-12-06 MED ORDER — FENTANYL CITRATE (PF) 100 MCG/2ML IJ SOLN
INTRAMUSCULAR | Status: DC | PRN
  Administered 2016-12-06: 50 ug via INTRAVENOUS

## 2016-12-06 MED ORDER — PROMETHAZINE HCL 25 MG/ML IJ SOLN: 6.2500 mg | INTRAMUSCULAR | Status: DC | PRN

## 2016-12-06 MED ORDER — ONDANSETRON HCL 4 MG/2ML IJ SOLN
INTRAMUSCULAR | Status: DC | PRN
  Administered 2016-12-06: 4 mg via INTRAVENOUS

## 2016-12-06 MED ORDER — OXYCODONE HCL 5 MG PO TABS
5.0000 mg | ORAL_TABLET | Freq: Once | ORAL | Status: DC | PRN
Start: 2016-12-06 — End: 2016-12-06

## 2016-12-06 MED ORDER — DEXAMETHASONE SODIUM PHOSPHATE 4 MG/ML IJ SOLN
INTRAMUSCULAR | Status: DC | PRN
  Administered 2016-12-06: 4 mg via INTRAVENOUS

## 2016-12-06 MED ORDER — LIDOCAINE-EPINEPHRINE 1 %-1:100000 IJ SOLN
INTRAMUSCULAR | Status: DC | PRN
  Administered 2016-12-06: 4 mL

## 2016-12-06 MED ORDER — LIDOCAINE HCL (CARDIAC) 20 MG/ML IV SOLN
INTRAVENOUS | Status: DC | PRN
  Administered 2016-12-06: 40 mg via INTRAVENOUS

## 2016-12-06 MED ORDER — GLYCOPYRROLATE 0.2 MG/ML IJ SOLN
INTRAMUSCULAR | Status: DC | PRN
  Administered 2016-12-06: 0.1 mg via INTRAVENOUS

## 2016-12-06 MED ORDER — BUPIVACAINE HCL 0.25 % IJ SOLN
INTRAMUSCULAR | Status: DC | PRN
  Administered 2016-12-06: 10 mL

## 2016-12-06 MED ORDER — ONDANSETRON HCL 4 MG/2ML IJ SOLN: 4.0000 mg | Freq: Four times a day (QID) | INTRAMUSCULAR | Status: DC | PRN

## 2016-12-06 MED ORDER — OXYCODONE HCL 5 MG/5ML PO SOLN: 5.0000 mg | Freq: Once | ORAL | Status: DC | PRN

## 2016-12-06 MED ORDER — POVIDONE-IODINE 7.5 % EX SOLN: Freq: Once | CUTANEOUS | Status: DC

## 2016-12-06 MED ORDER — CEFAZOLIN SODIUM-DEXTROSE 2-4 GM/100ML-% IV SOLN
2.0000 g | INTRAVENOUS | Status: AC
  Administered 2016-12-06: 2 g via INTRAVENOUS

## 2016-12-06 MED ORDER — FENTANYL CITRATE (PF) 100 MCG/2ML IJ SOLN: 25.0000 ug | INTRAMUSCULAR | Status: DC | PRN

## 2016-12-06 MED ORDER — MIDAZOLAM HCL 2 MG/2ML IJ SOLN
INTRAMUSCULAR | Status: DC | PRN
  Administered 2016-12-06: 2 mg via INTRAVENOUS

## 2016-12-06 MED ORDER — ONDANSETRON HCL 4 MG PO TABS: 4.0000 mg | ORAL_TABLET | Freq: Four times a day (QID) | ORAL | Status: DC | PRN

## 2016-12-06 MED ORDER — OXYCODONE-ACETAMINOPHEN 5-325 MG PO TABS: 1.0000 | ORAL_TABLET | ORAL | Status: DC | PRN

## 2016-12-06 MED ORDER — OXYCODONE-ACETAMINOPHEN 5-325 MG PO TABS: 1.0000 | ORAL_TABLET | ORAL | 0 refills | Status: DC | PRN

## 2016-12-06 MED ORDER — PROPOFOL 10 MG/ML IV BOLUS
INTRAVENOUS | Status: DC | PRN
  Administered 2016-12-06: 200 mg via INTRAVENOUS

## 2016-12-06 SURGICAL SUPPLY — 32 items
BANDAGE ELASTIC 4 VELCRO NS (GAUZE/BANDAGES/DRESSINGS) ×2 IMPLANT
BNDG COHESIVE 4X5 TAN STRL (GAUZE/BANDAGES/DRESSINGS) ×2 IMPLANT
BNDG ESMARK 4X12 TAN STRL LF (GAUZE/BANDAGES/DRESSINGS) ×2 IMPLANT
BNDG GAUZE 4.5X4.1 6PLY STRL (MISCELLANEOUS) ×2 IMPLANT
BNDG STRETCH 4X75 STRL LF (GAUZE/BANDAGES/DRESSINGS) ×2 IMPLANT
CANISTER SUCT 1200ML W/VALVE (MISCELLANEOUS) ×2 IMPLANT
COVER LIGHT HANDLE UNIVERSAL (MISCELLANEOUS) ×4 IMPLANT
DURAPREP 26ML APPLICATOR (WOUND CARE) ×2 IMPLANT
ELECT REM PT RETURN 9FT ADLT (ELECTROSURGICAL) ×2
ELECTRODE REM PT RTRN 9FT ADLT (ELECTROSURGICAL) ×1 IMPLANT
GAUZE PETRO XEROFOAM 1X8 (MISCELLANEOUS) ×2 IMPLANT
GAUZE SPONGE 4X4 12PLY STRL (GAUZE/BANDAGES/DRESSINGS) ×2 IMPLANT
GLOVE BIO SURGEON STRL SZ7.5 (GLOVE) ×2 IMPLANT
GLOVE INDICATOR 8.0 STRL GRN (GLOVE) ×2 IMPLANT
GOWN STRL REUS W/ TWL LRG LVL3 (GOWN DISPOSABLE) ×2 IMPLANT
GOWN STRL REUS W/TWL LRG LVL3 (GOWN DISPOSABLE) ×2
NEEDLE HYPO 25GX1X1/2 BEV (NEEDLE) ×4 IMPLANT
NS IRRIG 500ML POUR BTL (IV SOLUTION) ×2 IMPLANT
PACK EXTREMITY ARMC (MISCELLANEOUS) ×2 IMPLANT
STOCKINETTE IMPERVIOUS LG (DRAPES) ×2 IMPLANT
STRAP BODY AND KNEE 60X3 (MISCELLANEOUS) ×2 IMPLANT
STRIP CLOSURE SKIN 1/4X4 (GAUZE/BANDAGES/DRESSINGS) ×2 IMPLANT
SUT MNCRL+ 5-0 UNDYED PC-3 (SUTURE) ×2 IMPLANT
SUT MONOCRYL 5-0 (SUTURE) ×2
SUT PDS 3-0 (SUTURE) ×1
SUT PDS PLUS AB 3-0 PC-5 (SUTURE) ×1 IMPLANT
SUT VIC AB 3-0 SH 27 (SUTURE) ×1
SUT VIC AB 3-0 SH 27X BRD (SUTURE) ×1 IMPLANT
SUT VIC AB 4-0 FS2 27 (SUTURE) ×2 IMPLANT
SWABSTK COMLB BENZOIN TINCTURE (MISCELLANEOUS) ×2 IMPLANT
SYRINGE 10CC LL (SYRINGE) ×2 IMPLANT
WAND TOPAZ MICRO DEBRIDER (MISCELLANEOUS) ×2 IMPLANT

## 2016-12-06 NOTE — Anesthesia Postprocedure Evaluation (Signed)
Anesthesia Post Note  Patient: Kelsey Stephenson  Procedure(s) Performed: EXCISION MORTON'S NEUROMA (Left ) CORRECTION OVERLAPPING TOES-2ND TOE (Left )  Patient location during evaluation: PACU Anesthesia Type: General Level of consciousness: awake and alert Pain management: pain level controlled Vital Signs Assessment: post-procedure vital signs reviewed and stable Respiratory status: spontaneous breathing, nonlabored ventilation, respiratory function stable and patient connected to nasal cannula oxygen Cardiovascular status: blood pressure returned to baseline and stable Postop Assessment: no apparent nausea or vomiting Anesthetic complications: no    Ima Hafner ELAINE

## 2016-12-06 NOTE — Transfer of Care (Deleted)
Immediate Anesthesia Transfer of Care Note  Patient: Kelsey Stephenson  Procedure(s) Performed: EXCISION MORTON'S NEUROMA (Left ) CORRECTION OVERLAPPING TOES-2ND TOE (Left )  Patient Location: PACU  Anesthesia Type: General  Level of Consciousness: awake, alert  and patient cooperative  Airway and Oxygen Therapy: Patient Spontanous Breathing and Patient connected to supplemental oxygen  Post-op Assessment: Post-op Vital signs reviewed, Patient's Cardiovascular Status Stable, Respiratory Function Stable, Patent Airway and No signs of Nausea or vomiting  Post-op Vital Signs: Reviewed and stable  Complications: No apparent anesthesia complications

## 2016-12-06 NOTE — Op Note (Signed)
Operative note   Surgeon:Chemere Steffler    Assistant:none    Preop diagnosis:1. Once neuroma left second webspace 2. Synovitis second plantar metatarsophalangeal joint    Postop diagnosis: same    Procedure:1. Excision neuroma second webspace left foot 2.Debridement metatarsophalangeal joint plantar left second MTPJ    TJQ:ZESPQZ    Anesthesia:local and general    Hemostasis:epinephrine 1-200,000 infiltrated along the incision site    Specimen:neuroma second webspace left foot    Complications:none    Operative indications:Afomia Azaela Caracci is an 51 y.o. that presents today for surgical intervention.  The risks/benefits/alternatives/complications have been discussed and consent has been given.    Procedure:  Patient was brought into the OR and placed on the operating table in thesupine position. After anesthesia was obtained theleft lower extremity was prepped and draped in usual sterile fashion.  After infiltration of local anesthetic the area was prepped and draped in usual sterile fashion. Attention was directed to the plantar aspect of the second webspace where a curvilinear incision was performed beginning at the second MTPJ coursing to about the mid shaft of the second metatarsal and second intermetatarsal space. Incision was taken down through the epidermis and dermal layer into subcutaneous tissue. Blunt dissection was carried down deeper. At this time the interdigital nerve of the second webspace was noted deep to the deep transverse intermetatarsal ligament. This was freed both proximal and distal. The 2 arms to the  And third toes were transected sharply. This was carried back proximally into the mid shaft region and transected. This was sent for pathological examination. Further inspection did not reveal any other abnormality. The intermetatarsal space was bluntly entered without signs of any intermetatarsal bursa at this time. The plantar second MTPJ was evaluated and all of  the subcutaneous tissue was debrided away from the plantar plate. There was no some thickening of the plantar plate and a small wedge of tissue was removed at this thickened region. This was then reanastomosed with a 3-0 PDS. The toe was held in a slightly plantarflexed position with 3 anastomosis. Care long flexor tendon that was noted within the wedge of tissemoved from the planplate. The surrounding tissue was then infiltrated with a Tpaz wand.All wounds were flushed with copious amounts or irrigation. Layered closure was performed with a 3-0 Vicryl for the subcutaneous tissue and 4-0 Vicryl for the subcuticular tissue as well as a 5-0 Monocryl for the subcuticular tissue. 0.25% Marcaine was placed around all areas.A bulky sterile bandage was then applied.    Patient tolerated the procedure and anesthesia well.  Was transported from the OR to the PACU with all vital signs stable and vascular status intact. To be discharged per routine protocol.  Will follow up in approximately 1 week in the outpatient clinic.

## 2016-12-06 NOTE — H&P (Signed)
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  The various methods of treatment have been discussed with the patient.  No guarantees were given.  After consideration of risks, benefits and other options for treatment, the patient has consented to surgery.  I have reviewed the patients' chart and labs.    Patient Vitals for the past 24 hrs:  BP Temp Temp src Pulse SpO2 Weight  12/06/16 1118 131/79 (!) 97.3 F (36.3 C) Temporal 76 100 % 87.5 kg (193 lb)    Stephenson history and physical examination was performed in my office.  The patient was reexamined.  There have been no changes to this history and physical examination.  Kelsey Stephenson

## 2016-12-06 NOTE — Anesthesia Procedure Notes (Signed)
Procedure Name: LMA Insertion Date/Time: 12/06/2016 12:27 PM Performed by: Mayme Genta Pre-anesthesia Checklist: Patient identified, Emergency Drugs available, Suction available, Timeout performed and Patient being monitored Patient Re-evaluated:Patient Re-evaluated prior to induction Oxygen Delivery Method: Circle system utilized Preoxygenation: Pre-oxygenation with 100% oxygen Induction Type: IV induction LMA: LMA inserted LMA Size: 4.0 Number of attempts: 1 Placement Confirmation: positive ETCO2 and breath sounds checked- equal and bilateral Tube secured with: Tape

## 2016-12-06 NOTE — Anesthesia Preprocedure Evaluation (Signed)
Anesthesia Evaluation  Patient identified by MRN, date of birth, ID band Patient awake    Reviewed: Allergy & Precautions, H&P , NPO status , Patient's Chart, lab work & pertinent test results, reviewed documented beta blocker date and time   Airway Mallampati: II  TM Distance: >3 FB Neck ROM: full    Dental no notable dental hx.    Pulmonary neg pulmonary ROS,    Pulmonary exam normal breath sounds clear to auscultation       Cardiovascular Exercise Tolerance: Good hypertension, negative cardio ROS   Rhythm:regular Rate:Normal     Neuro/Psych negative neurological ROS  negative psych ROS   GI/Hepatic Neg liver ROS, GERD  ,  Endo/Other  negative endocrine ROS  Renal/GU negative Renal ROS  negative genitourinary   Musculoskeletal   Abdominal   Peds  Hematology negative hematology ROS (+)   Anesthesia Other Findings   Reproductive/Obstetrics negative OB ROS                             Anesthesia Physical Anesthesia Plan  ASA: II  Anesthesia Plan: General   Post-op Pain Management:    Induction:   PONV Risk Score and Plan:   Airway Management Planned:   Additional Equipment:   Intra-op Plan:   Post-operative Plan:   Informed Consent: I have reviewed the patients History and Physical, chart, labs and discussed the procedure including the risks, benefits and alternatives for the proposed anesthesia with the patient or authorized representative who has indicated his/her understanding and acceptance.   Dental Advisory Given  Plan Discussed with: CRNA  Anesthesia Plan Comments:         Anesthesia Quick Evaluation

## 2016-12-06 NOTE — Transfer of Care (Signed)
Immediate Anesthesia Transfer of Care Note  Patient: Kelsey Stephenson  Procedure(s) Performed: EXCISION MORTON'S NEUROMA (Left ) CORRECTION OVERLAPPING TOES-2ND TOE (Left )  Patient Location: PACU  Anesthesia Type: General  Level of Consciousness: awake, alert  and patient cooperative  Airway and Oxygen Therapy: Patient Spontanous Breathing and Patient connected to supplemental oxygen  Post-op Assessment: Post-op Vital signs reviewed, Patient's Cardiovascular Status Stable, Respiratory Function Stable, Patent Airway and No signs of Nausea or vomiting  Post-op Vital Signs: Reviewed and stable  Complications: No apparent anesthesia complications

## 2016-12-06 NOTE — Transfer of Care (Deleted)
Immediate Anesthesia Transfer of Care Note  Patient: Tena Linebaugh  Procedure(s) Performed: EXCISION MORTON'S NEUROMA (Left ) CORRECTION OVERLAPPING TOES-2ND TOE (Left )  Patient Location: PACU  Anesthesia Type: General  Level of Consciousness: awake, alert  and patient cooperative  Airway and Oxygen Therapy: Patient Spontanous Breathing and Patient connected to supplemental oxygen  Post-op Assessment: Post-op Vital signs reviewed, Patient's Cardiovascular Status Stable, Respiratory Function Stable, Patent Airway and No signs of Nausea or vomiting  Post-op Vital Signs: Reviewed and stable  Complications: No apparent anesthesia complications

## 2016-12-07 ENCOUNTER — Encounter: Payer: Self-pay | Admitting: Podiatry

## 2016-12-08 LAB — SURGICAL PATHOLOGY

## 2017-08-13 ENCOUNTER — Ambulatory Visit
Admission: EM | Admit: 2017-08-13 | Discharge: 2017-08-13 | Disposition: A | Discharge status: Home / Self Care | Payer: BLUE CROSS/BLUE SHIELD | Attending: Internal Medicine | Admitting: Internal Medicine

## 2017-08-13 ENCOUNTER — Encounter: Payer: Self-pay | Admitting: Emergency Medicine

## 2017-08-13 ENCOUNTER — Other Ambulatory Visit: Payer: Self-pay

## 2017-08-13 DIAGNOSIS — Z8489 Family history of other specified conditions: Secondary | ICD-10-CM | POA: Diagnosis not present

## 2017-08-13 DIAGNOSIS — Z90722 Acquired absence of ovaries, bilateral: Secondary | ICD-10-CM | POA: Insufficient documentation

## 2017-08-13 DIAGNOSIS — R142 Eructation: Secondary | ICD-10-CM | POA: Diagnosis not present

## 2017-08-13 DIAGNOSIS — Z79899 Other long term (current) drug therapy: Secondary | ICD-10-CM | POA: Diagnosis not present

## 2017-08-13 DIAGNOSIS — R9431 Abnormal electrocardiogram [ECG] [EKG]: Secondary | ICD-10-CM | POA: Diagnosis not present

## 2017-08-13 DIAGNOSIS — I10 Essential (primary) hypertension: Secondary | ICD-10-CM | POA: Diagnosis not present

## 2017-08-13 DIAGNOSIS — R0789 Other chest pain: Secondary | ICD-10-CM | POA: Insufficient documentation

## 2017-08-13 DIAGNOSIS — K219 Gastro-esophageal reflux disease without esophagitis: Secondary | ICD-10-CM | POA: Diagnosis not present

## 2017-08-13 DIAGNOSIS — Z90710 Acquired absence of both cervix and uterus: Secondary | ICD-10-CM | POA: Diagnosis not present

## 2017-08-13 DIAGNOSIS — Z8249 Family history of ischemic heart disease and other diseases of the circulatory system: Secondary | ICD-10-CM | POA: Diagnosis not present

## 2017-08-13 DIAGNOSIS — Z9889 Other specified postprocedural states: Secondary | ICD-10-CM | POA: Diagnosis not present

## 2017-08-13 NOTE — ED Triage Notes (Signed)
Patient states she developed chest pain while cooking dinner.  Patient states she feels like she needs to get her bra off.

## 2017-08-13 NOTE — Discharge Instructions (Addendum)
ECG in the urgent care tonight was not different than previous ECGs.  No danger signs on exam or in history.  Chest discomfort has many possible causes including musculoskeletal irritation/injury, spasm of airways or esophagus, viral infection.  Would try omeprazole (prilosec) at a dose of 40mg  twice daily for a couple weeks.  Recheck with your primary care provider if persistent.

## 2017-08-14 NOTE — ED Provider Notes (Signed)
[facility]    CSN: [id number] Arrival date & time: 08/13/17  1831     History   Chief Complaint Chief Complaint  Patient presents with  . Chest Pain    HPI Kelsey Stephenson is a 52 y.o. female. Patient presents this evening with chest discomfort, described as a central chest heaviness or tightness, while she was cooking dinner.  She suddenly felt that she had to get her bra off immediately.  This lasted for a couple minutes, and has now subsided.  No recent change in exercise tolerance.  No unusual leg pain or swelling, no breathlessness.  She has had some episodes of chest discomfort in the past, with negative work-ups. No cough, runny/congested nose, sore throat.  She has had some intermittent discomfort in the left upper abdomen for couple weeks, seems to be related to changes in position. She does have a history of hypertension.  No diabetes, no hyperlipidemia.  She is a non-smoker and has never smoked.  No family history of heart disease. She does have history of indigestion, not currently taking any medicine for this.  Having a lot of burping.   HPI  Past Medical History:  Diagnosis Date  . Family history of adverse reaction to anesthesia    Mother - PONV  . GERD (gastroesophageal reflux disease)   . Hypertension   . Vertigo   . Vertigo    last episode 2-3 mos ago    There are no active problems to display for this patient.   Past Surgical History:  Procedure Laterality Date  . ABDOMINAL HYSTERECTOMY    . BILATERAL OOPHORECTOMY    . CORRECTION OVERLAPPING TOES Left 12/06/2016   Procedure: CORRECTION OVERLAPPING TOES-2ND TOE;  Surgeon: [name], DPM;  Location: [facility];  Service: Podiatry;  Laterality: Left;  . EXCISION MORTON'S NEUROMA Left 12/06/2016   Procedure: EXCISION MORTON'S NEUROMA;  Surgeon: [name], DPM;  Location: [facility];  Service: Podiatry;  Laterality: Left;     Home Medications    Prior to  Admission medications   Medication Sig Start Date End Date Taking? Authorizing Provider  ALPRAZolam Duanne Moron) 0.5 MG tablet Take 0.5 mg by mouth at bedtime as needed for anxiety.   Yes [provider]  buPROPion (WELLBUTRIN) 100 MG tablet Take 200 mg by mouth daily.   Yes [provider]  fluvoxaMINE (LUVOX) 100 MG tablet Take 100 mg by mouth at bedtime.   Yes [provider]  metoprolol tartrate (LOPRESSOR) 25 MG tablet Take 25 mg by mouth daily.   Yes [provider]  Probiotic Product (PROBIOTIC PO) Take by mouth.    [provider]    Family History Family History  Problem Relation Age of Onset  . Hypertension Father   . Breast cancer Neg Hx     Social History Social History   Tobacco Use  . Smoking status: Never Smoker  . Smokeless tobacco: Never Used  Substance Use Topics  . Alcohol use: No  . Drug use: No     Allergies   Patient has no known allergies.   Review of Systems Review of Systems  All other systems reviewed and are negative.    Physical Exam Triage Vital Signs ED Triage Vitals  Enc Vitals Group     BP 08/13/17 1841 (!) 144/96     Pulse Rate 08/13/17 1841 84     Resp 08/13/17 1841 18     Temp 08/13/17 1841 98 F (36.7  C)     Temp src --      SpO2 08/13/17 1841 99 %     Weight 08/13/17 1838 180 lb (81.6 kg)     Height 08/13/17 1838 5\' 7"  (1.702 m)     Pain Score 08/13/17 1912 0     Pain Loc --    Updated Vital Signs BP (!) 144/96   Pulse 84   Temp 98 F (36.7 C)   Resp 18   Ht 5\' 7"  (1.702 m)   Wt 180 lb (81.6 kg)   SpO2 99%   BMI 28.19 kg/m  Physical Exam  Constitutional: She is oriented to person, place, and time. No distress.  Alert, nicely groomed  HENT:  Head: Atraumatic.  Eyes:  Conjugate gaze, no eye redness/drainage  Neck: Neck supple.  Cardiovascular: Normal rate and regular rhythm.  Pulmonary/Chest: No respiratory distress. She has no wheezes. She has no rales.  Lungs clear,  symmetric breath sounds  Abdominal: Soft. She exhibits no distension. There is no tenderness. There is no rebound and no guarding.  Musculoskeletal: Normal range of motion. She exhibits no edema.  No leg swelling  Neurological: She is alert and oriented to person, place, and time.  Skin: Skin is warm and dry.  No cyanosis  Nursing note and vitals reviewed.    UC Treatments / Results  Labs (all labs ordered are listed, but only abnormal results are displayed) Labs Reviewed - No data to display  EKG NSR, HR 71, no acute ST or T wave changes, no dysrhythmia  I do not appreciate any change from ECG dated 09/28/15 01:49:18.  Radiology No results found.  Procedures Procedures (including critical care time)  Medications Ordered in UC Medications - No data to display   Final Clinical Impressions(s) / UC Diagnoses   Final diagnoses:  Atypical chest pain  Burping     Discharge Instructions     ECG in the urgent care tonight was not different than previous ECGs.  No danger signs on exam or in history.  Chest discomfort has many possible causes including musculoskeletal irritation/injury, spasm of airways or esophagus, viral infection.  Would try omeprazole (prilosec) at a dose of 40mg  twice daily for a couple weeks.  Recheck with your primary care provider if persistent.        Wynona Luna, MD 08/14/17 1145

## 2017-11-16 ENCOUNTER — Other Ambulatory Visit: Payer: Self-pay | Admitting: Nurse Practitioner

## 2017-11-16 DIAGNOSIS — Z1231 Encounter for screening mammogram for malignant neoplasm of breast: Secondary | ICD-10-CM

## 2017-12-03 ENCOUNTER — Ambulatory Visit
Admission: RE | Admit: 2017-12-03 | Discharge: 2017-12-03 | Disposition: A | Discharge status: Home / Self Care | Payer: BLUE CROSS/BLUE SHIELD | Source: Ambulatory Visit | Attending: Nurse Practitioner | Admitting: Nurse Practitioner

## 2017-12-03 DIAGNOSIS — Z1231 Encounter for screening mammogram for malignant neoplasm of breast: Secondary | ICD-10-CM

## 2017-12-16 ENCOUNTER — Other Ambulatory Visit: Payer: Self-pay | Admitting: Physician Assistant

## 2017-12-16 ENCOUNTER — Encounter: Payer: Self-pay | Admitting: Emergency Medicine

## 2017-12-16 DIAGNOSIS — F329 Major depressive disorder, single episode, unspecified: Secondary | ICD-10-CM

## 2017-12-16 DIAGNOSIS — F411 Generalized anxiety disorder: Secondary | ICD-10-CM | POA: Insufficient documentation

## 2017-12-16 DIAGNOSIS — F429 Obsessive-compulsive disorder, unspecified: Secondary | ICD-10-CM | POA: Insufficient documentation

## 2017-12-19 NOTE — Telephone Encounter (Signed)
Still unable to locate chart

## 2017-12-27 ENCOUNTER — Ambulatory Visit: Payer: Self-pay | Admitting: Physician Assistant

## 2018-01-21 ENCOUNTER — Other Ambulatory Visit: Payer: Self-pay | Admitting: Physician Assistant

## 2018-01-22 ENCOUNTER — Other Ambulatory Visit: Payer: Self-pay | Admitting: Physician Assistant

## 2018-02-05 ENCOUNTER — Encounter: Payer: Self-pay | Admitting: Physician Assistant

## 2018-02-05 ENCOUNTER — Ambulatory Visit (INDEPENDENT_AMBULATORY_CARE_PROVIDER_SITE_OTHER): Payer: BLUE CROSS/BLUE SHIELD | Admitting: Physician Assistant

## 2018-02-05 DIAGNOSIS — F429 Obsessive-compulsive disorder, unspecified: Secondary | ICD-10-CM | POA: Diagnosis not present

## 2018-02-05 DIAGNOSIS — F411 Generalized anxiety disorder: Secondary | ICD-10-CM

## 2018-02-05 DIAGNOSIS — F331 Major depressive disorder, recurrent, moderate: Secondary | ICD-10-CM

## 2018-02-05 MED ORDER — ALPRAZOLAM 0.5 MG PO TABS: 0.5000 mg | ORAL_TABLET | Freq: Two times a day (BID) | ORAL | 5 refills | Status: DC | PRN

## 2018-02-05 MED ORDER — BUPROPION HCL ER (XL) 150 MG PO TB24: 150.0000 mg | ORAL_TABLET | Freq: Every morning | ORAL | 1 refills | Status: DC

## 2018-02-05 MED ORDER — FLUVOXAMINE MALEATE ER 100 MG PO CP24: 250.0000 mg | ORAL_CAPSULE | Freq: Every day | ORAL | 1 refills | Status: DC

## 2018-02-05 NOTE — Progress Notes (Signed)
[facility]  Patient ID: Kelsey Stephenson,  MRN: [id number]  PCP: Sallee Lange, NP  Date of Evaluation: 02/05/2018 Time spent:15 minutes  Chief Complaint:  Chief Complaint    Follow-up; Medication Refill      HISTORY/CURRENT STATUS: HPI here for 66-month med check.  Overall states she is doing well.Patient denies loss of interest in usual activities and is able to enjoy things.  Denies decreased energy or motivation.  Appetite has not changed.  No extreme sadness, tearfulness, or feelings of hopelessness.  Denies any changes in concentration, making decisions or remembering things.  Denies suicidal or homicidal thoughts.  The biggest problem that she has is that been she feels any little twinge in her body, she thinks the worst is going to happen.  She is afraid she is going to have a heart attack or either has cancer.  This is worsened a little bit over the past few months.  Over the summer, she was much better.  She is not having any compulsions.  The anxiety is usually generalized except for the above.  She does not always need the Xanax on a daily basis but usually takes it at night.  It helps calm her racing thoughts so she can go to sleep.  She sleeps well.  She does not work outside the home.  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  Individual Medical History/ Review of Systems: Changes? :Yes  Has GERD and new medication of omeprazole  Past medications for mental health diagnoses include: Paxil, Prozac made her worse, BuSpar increased blood pressure, Celexa, Wellbutrin XL and SR, Xanax, Luvox  Allergies: Patient has no known allergies.  Current Medications:  Current Outpatient Medications:  .  ALPRAZolam (XANAX) 0.5 MG tablet, TAKE 1/2 TO 1 (ONE-HALF TO ONE) TABLET BY MOUTH TWICE DAILY AS NEEDED FOR ANXIETY, Disp: 60 tablet, Rfl: 0 .  buPROPion (WELLBUTRIN XL) 150 MG 24 hr tablet, Take 1 tablet (150 mg total) by mouth every morning., Disp: 90 tablet, Rfl:  1 .  fluvoxaMINE (LUVOX) 100 MG tablet, Take 200 mg by mouth at bedtime. , Disp: , Rfl:  .  metoprolol succinate (TOPROL-XL) 50 MG 24 hr tablet, Take 50 mg by mouth daily. Take with or immediately following a meal., Disp: , Rfl:  .  omeprazole (PRILOSEC) 20 MG capsule, Take 20 mg by mouth 2 (two) times daily before a meal., Disp: , Rfl:  .  ALPRAZolam (XANAX) 0.5 MG tablet, Take 1 tablet (0.5 mg total) by mouth 2 (two) times daily as needed for anxiety., Disp: 60 tablet, Rfl: 5 .  Fluvoxamine Maleate 100 MG CP24, Take 2.5 capsules (250 mg total) by mouth at bedtime., Disp: 225 each, Rfl: 1 .  Probiotic Product (PROBIOTIC PO), Take by mouth., Disp: , Rfl:  Medication Side Effects: none  Family Medical/ Social History: Changes? No  MENTAL HEALTH EXAM:  There were no vitals taken for this visit.There is no height or weight on file to calculate BMI.  General Appearance: Casual and Well Groomed  Eye Contact:  Good  Speech:  Clear and Coherent  Volume:  Normal  Mood:  Euthymic  Affect:  Appropriate  Thought Process:  Goal Directed  Orientation:  Full (Time, Place, and Person)  Thought Content: Logical   Suicidal Thoughts:  No  Homicidal Thoughts:  No  Memory:  WNL  Judgement:  Good  Insight:  Good  Psychomotor Activity:  Normal  Concentration:  Concentration: Good  Recall:  Good  Fund of  Knowledge: Good  Language: Good  Assets:  Desire for Improvement  ADL's:  Intact  Cognition: WNL  Prognosis:  Good    DIAGNOSES:    ICD-10-CM   1. Major depressive disorder, recurrent episode, moderate (HCC) F33.1   2. Obsessive-compulsive disorder, unspecified type F42.9   3. Generalized anxiety disorder F41.1     Receiving Psychotherapy: No    RECOMMENDATIONS: Increase Luvox to 250 mg.  She will call if she has any side effects from the increase. Continue Xanax and Wellbutrin as above. Consider psychotherapy if needed in the future. Return in 6 weeks or sooner as needed   Donnal Moat, PA-C

## 2018-03-19 ENCOUNTER — Ambulatory Visit: Payer: BLUE CROSS/BLUE SHIELD | Admitting: Physician Assistant

## 2018-05-13 ENCOUNTER — Telehealth: Payer: Self-pay | Admitting: Physician Assistant

## 2018-05-13 NOTE — Telephone Encounter (Signed)
Mada called to see if it would be ok for her to increase her Xanax if needed.  She has had a family emergency and feels she may need a little extra dose.  Please call to ok.  She doesn't want a refill, just an ik to increase if needed.

## 2018-05-28 ENCOUNTER — Other Ambulatory Visit: Payer: Self-pay

## 2018-05-28 ENCOUNTER — Encounter: Payer: Self-pay | Admitting: Physician Assistant

## 2018-05-28 ENCOUNTER — Ambulatory Visit (INDEPENDENT_AMBULATORY_CARE_PROVIDER_SITE_OTHER): Payer: BLUE CROSS/BLUE SHIELD | Admitting: Physician Assistant

## 2018-05-28 DIAGNOSIS — F331 Major depressive disorder, recurrent, moderate: Secondary | ICD-10-CM

## 2018-05-28 DIAGNOSIS — F429 Obsessive-compulsive disorder, unspecified: Secondary | ICD-10-CM | POA: Diagnosis not present

## 2018-05-28 DIAGNOSIS — F411 Generalized anxiety disorder: Secondary | ICD-10-CM | POA: Diagnosis not present

## 2018-05-28 MED ORDER — FLUVOXAMINE MALEATE ER 100 MG PO CP24: 200.0000 mg | ORAL_CAPSULE | Freq: Every day | ORAL | 1 refills | Status: DC

## 2018-05-28 MED ORDER — BUPROPION HCL ER (XL) 150 MG PO TB24: 150.0000 mg | ORAL_TABLET | Freq: Every morning | ORAL | 1 refills | Status: DC

## 2018-05-28 NOTE — Progress Notes (Signed)
[facility]  Patient ID: Kelsey Stephenson,  MRN: [id number]  PCP: Sallee Lange, NP  Date of Evaluation: 05/28/2018 Time spent:15 minutes  Chief Complaint:  Chief Complaint    Follow-up     Virtual Visit via Telephone Note  I connected with Kelsey Stephenson on 05/28/18 at 11:30 AM EDT by telephone and verified that I am speaking with the correct person using two identifiers.   I discussed the limitations, risks, security and privacy concerns of performing an evaluation and management service by telephone and the availability of in person appointments. I also discussed with the patient that there may be a patient responsible charge related to this service. The patient expressed understanding and agreed to proceed.    HISTORY/CURRENT STATUS: HPI For routine med check.  Didn't increase the Luvox until about 2 months ago.  "I was not really sure if I needed to go up or not. I think it's made me worse!  I feel less enthusiastic, and a little more anxious.  I have to take a Xanax every morning after I get up because just feel uneasy."  Feels a lot less energy and motivation.  Has things in the home that need to be done but she does not have the desire to do them and says that is not like her at all.   As far as the anxiety goes, states she is not worrying about having a heart attack anymore but thinks it is related to having the coronavirus concerns now.  Her son-in-law had it but has recovered well.  Daughter is pregnant, due in May and she has been worried about her.  But so far she has not had any symptoms.  Patient states she is not terribly worried about herself getting sick from it, but other people.  She is not obsessing about things in general as much as she was.    She sleeps okay.  She is working from home now due to the pandemic.  Denies muscle or joint pain, stiffness, or dystonia.  Denies dizziness, syncope, seizures, numbness, tingling, tremor, tics,  unsteady gait, slurred speech, confusion.   Individual Medical History/ Review of Systems: Changes? :No    Past medications for mental health diagnoses include: Paxil, Prozac made her worse, BuSpar increased blood pressure, Celexa, Wellbutrin XL and SR, Xanax, Luvox  Allergies: Patient has no known allergies.  Current Medications:  Current Outpatient Medications:  .  ALPRAZolam (XANAX) 0.5 MG tablet, Take 1 tablet (0.5 mg total) by mouth 2 (two) times daily as needed for anxiety., Disp: 60 tablet, Rfl: 5 .  buPROPion (WELLBUTRIN XL) 150 MG 24 hr tablet, Take 1 tablet (150 mg total) by mouth every morning., Disp: 90 tablet, Rfl: 1 .  Fluvoxamine Maleate 100 MG CP24, Take 2 capsules (200 mg total) by mouth at bedtime., Disp: 180 each, Rfl: 1 .  metoprolol succinate (TOPROL-XL) 50 MG 24 hr tablet, Take 50 mg by mouth daily. Take with or immediately following a meal., Disp: , Rfl:  .  omeprazole (PRILOSEC) 20 MG capsule, Take 20 mg by mouth 2 (two) times daily as needed. , Disp: , Rfl:  .  Probiotic Product (PROBIOTIC PO), Take by mouth., Disp: , Rfl:  Medication Side Effects: none  Family Medical/ Social History: Changes? Yes Is sheltering in place b/c coronavirus pandemic   MENTAL HEALTH EXAM:  There were no vitals taken for this visit.There is no height or weight on file to calculate BMI.  General Appearance:  phone visit, unable to assess  Eye Contact:  unable to access  Speech:  Clear and Coherent  Volume:  Normal  Mood:  Euthymic  Affect:  unable to assess  Thought Process:  Goal Directed  Orientation:  Full (Time, Place, and Person)  Thought Content: Logical   Suicidal Thoughts:  No  Homicidal Thoughts:  No  Memory:  WNL  Judgement:  Good  Insight:  Good  Psychomotor Activity:  unable to assess  Concentration:  Concentration: Good  Recall:  Good  Fund of Knowledge: Good  Language: Good  Assets:  Desire for Improvement  ADL's:  Intact  Cognition: WNL  Prognosis:  Good     DIAGNOSES:    ICD-10-CM   1. Generalized anxiety disorder F41.1   2. Major depressive disorder, recurrent episode, moderate (HCC) F33.1   3. Obsessive-compulsive disorder, unspecified type F42.9     Receiving Psychotherapy: No    RECOMMENDATIONS: We discussed different options and agreed to decrease the Luvox back to 200 mg.  The worsening symptoms did not occur until she increased the Luvox, however they began around the same time as the coronavirus epidemic started hitting the news and got worse as time went on.  Another option for helping with increased energy and motivation would be to increase the Wellbutrin.  She has been on a higher dose in the past and does not remember whether that increased anxiety or not.  That could possibly be a negative side effect however. Decrease Luvox to 200 mg p.o. nightly. Continue Wellbutrin XL 150 mg p.o. every morning. Continue Xanax 0.5 mg 1 twice daily as needed. Return in 6 weeks.  Donnal Moat, PA-C   This record has been created using [employer].  Chart creation errors have been sought, but may not always have been located and corrected. Such creation errors do not reflect on the standard of medical care.

## 2018-05-29 ENCOUNTER — Other Ambulatory Visit: Payer: Self-pay | Admitting: Physician Assistant

## 2018-05-29 ENCOUNTER — Telehealth: Payer: Self-pay | Admitting: Physician Assistant

## 2018-05-29 MED ORDER — FLUVOXAMINE MALEATE 100 MG PO TABS: 200.0000 mg | ORAL_TABLET | Freq: Every day | ORAL | 1 refills | Status: DC

## 2018-05-29 NOTE — Telephone Encounter (Signed)
Corrected RX sent in.

## 2018-05-29 NOTE — Telephone Encounter (Signed)
Pt called to advise new Rx needed for Luvox generic 100 mg 2  tablets@ bedtime to [facility]. One was sent but it was not correct. It was Luvox ER CAPS.

## 2018-07-03 ENCOUNTER — Ambulatory Visit (INDEPENDENT_AMBULATORY_CARE_PROVIDER_SITE_OTHER): Payer: BLUE CROSS/BLUE SHIELD | Admitting: Physician Assistant

## 2018-07-03 ENCOUNTER — Other Ambulatory Visit: Payer: Self-pay

## 2018-07-03 ENCOUNTER — Encounter: Payer: Self-pay | Admitting: Physician Assistant

## 2018-07-03 DIAGNOSIS — F429 Obsessive-compulsive disorder, unspecified: Secondary | ICD-10-CM | POA: Diagnosis not present

## 2018-07-03 DIAGNOSIS — F411 Generalized anxiety disorder: Secondary | ICD-10-CM

## 2018-07-03 DIAGNOSIS — F331 Major depressive disorder, recurrent, moderate: Secondary | ICD-10-CM

## 2018-07-03 NOTE — Progress Notes (Signed)
[facility]  Patient ID: Kelsey Stephenson,  MRN: [id number]  PCP: Sallee Lange, NP  Date of Evaluation: 07/03/2018 Time spent:15 minutes  Chief Complaint:  Chief Complaint    Depression; Anxiety; Follow-up     Virtual Visit via Telephone Note  I connected with patient by a video enabled telemedicine application or telephone, with their informed consent, and verified patient privacy and that I am speaking with the correct person using two identifiers.  I am private, at [place] and the patient is home.   I discussed the limitations, risks, security and privacy concerns of performing an evaluation and management service by telephone and the availability of in person appointments. I also discussed with the patient that there may be a patient responsible charge related to this service. The patient expressed understanding and agreed to proceed.   I discussed the assessment and treatment plan with the patient. The patient was provided an opportunity to ask questions and all were answered. The patient agreed with the plan and demonstrated an understanding of the instructions.   The patient was advised to call back or seek an in-person evaluation if the symptoms worsen or if the condition fails to improve as anticipated.  I provided 15 minutes of non-face-to-face time during this encounter.  HISTORY/CURRENT STATUS: HPI for 6-week med check.  At the last visit, we decreased the Luvox due to more anxiety, and less energy and motivation and a "flatness" of her personality.  States she feels a lot better.  She is able to enjoy things.  Her energy and motivation are back to normal.  She is not isolating any more than she has to because of the coronavirus pandemic.  Does not cry easily.  Anxiety is much better controlled.  Her daughter is due to have her baby any day now.  Patient is not as worried about her as she was.  The patient also has a horse that just had a foal 2  weeks ago so that is been fine and exciting to.  She sleeps well.  She is working from home due to the pandemic.  Denies dizziness, syncope, seizures, numbness, tingling, tremor, tics, unsteady gait, slurred speech, confusion.  Denies muscle or joint pain, stiffness, or dystonia.  Individual Medical History/ Review of Systems: Changes? :No    Past medications for mental health diagnoses include: Paxil, Prozac made her worse, BuSpar increased blood pressure, Celexa, Wellbutrin XL and SR, Xanax, Luvox  Allergies: Patient has no known allergies.  Current Medications:  Current Outpatient Medications:  .  ALPRAZolam (XANAX) 0.5 MG tablet, Take 1 tablet (0.5 mg total) by mouth 2 (two) times daily as needed for anxiety., Disp: 60 tablet, Rfl: 5 .  buPROPion (WELLBUTRIN XL) 150 MG 24 hr tablet, Take 1 tablet (150 mg total) by mouth every morning., Disp: 90 tablet, Rfl: 1 .  fluvoxaMINE (LUVOX) 100 MG tablet, Take 2 tablets (200 mg total) by mouth at bedtime., Disp: 180 tablet, Rfl: 1 .  metoprolol succinate (TOPROL-XL) 50 MG 24 hr tablet, Take 50 mg by mouth daily. Take with or immediately following a meal., Disp: , Rfl:  .  omeprazole (PRILOSEC) 20 MG capsule, Take 20 mg by mouth 2 (two) times daily as needed. , Disp: , Rfl:  .  Probiotic Product (PROBIOTIC PO), Take by mouth., Disp: , Rfl:  Medication Side Effects: none  Family Medical/ Social History: Changes? No  MENTAL HEALTH EXAM:  There were no vitals taken for this visit.There is no  height or weight on file to calculate BMI.  General Appearance: unable to assess  Eye Contact:  unable to assess  Speech:  Clear and Coherent  Volume:  Normal  Mood:  Euthymic  Affect:  unable to assess  Thought Process:  Goal Directed  Orientation:  Full (Time, Place, and Person)  Thought Content: Logical   Suicidal Thoughts:  No  Homicidal Thoughts:  No  Memory:  WNL  Judgement:  Good  Insight:  Good  Psychomotor Activity:  unable to assess   Concentration:  Concentration: Good  Recall:  Good  Fund of Knowledge: Good  Language: Good  Assets:  Desire for Improvement  ADL's:  Intact  Cognition: WNL  Prognosis:  Good    DIAGNOSES:    ICD-10-CM   1. Generalized anxiety disorder F41.1   2. Obsessive-compulsive disorder, unspecified type F42.9   3. Major depressive disorder, recurrent episode, moderate (HCC) F33.1     Receiving Psychotherapy: No    RECOMMENDATIONS: Glad to see her doing better! Continue Luvox 100 mg, 2 nightly. Continue Wellbutrin XL 150 mg every morning. Continue Xanax 0.5 mg 1 twice daily as needed. Return in 3 months.  Donnal Moat, PA-C   This record has been created using [employer].  Chart creation errors have been sought, but may not always have been located and corrected. Such creation errors do not reflect on the standard of medical care.

## 2018-08-27 ENCOUNTER — Telehealth: Payer: Self-pay | Admitting: Physician Assistant

## 2018-08-27 NOTE — Telephone Encounter (Signed)
Pt. Has Xanax that she very rarely takes unless she needs it for sleep. Recently she is having a lot of trouble falling asleep and only takes the Xanax as a last resort. She would like to know if it is okay to take this nightly to help her fall asleep temporarily.

## 2018-08-27 NOTE — Telephone Encounter (Signed)
Yes, it's okay.  She can try melatonin if she hasn't already.  Also make sure she doesn't use electronics with 2 hours before she needs to go to sleep.  No caffeine after 2 pm. If she needs Xanax RF, let me know.

## 2018-08-27 NOTE — Telephone Encounter (Signed)
Please triage. Thanks.

## 2018-08-27 NOTE — Telephone Encounter (Signed)
Patient would like to speak with you she is having issues with falling asleep, has a question about a medication that she is currently taking for sleep

## 2018-08-28 NOTE — Telephone Encounter (Signed)
ok 

## 2018-08-28 NOTE — Telephone Encounter (Signed)
Pt. Made aware.

## 2018-09-12 ENCOUNTER — Other Ambulatory Visit: Payer: Self-pay | Admitting: Physician Assistant

## 2018-10-03 ENCOUNTER — Ambulatory Visit: Payer: BLUE CROSS/BLUE SHIELD | Admitting: Physician Assistant

## 2018-11-21 ENCOUNTER — Other Ambulatory Visit: Payer: Self-pay | Admitting: Nurse Practitioner

## 2018-11-21 DIAGNOSIS — Z1231 Encounter for screening mammogram for malignant neoplasm of breast: Secondary | ICD-10-CM

## 2018-11-24 ENCOUNTER — Other Ambulatory Visit: Payer: Self-pay | Admitting: Physician Assistant

## 2018-11-28 ENCOUNTER — Other Ambulatory Visit: Payer: Self-pay | Admitting: Physician Assistant

## 2018-11-28 NOTE — Telephone Encounter (Signed)
Last refill 07/23 Last visit 06/2018, was due back in 3 months

## 2018-11-29 NOTE — Telephone Encounter (Signed)
Kelsey Stephenson, please have her make appt.  I will go ahead and send in Rx now though.  Thanks

## 2018-12-17 ENCOUNTER — Ambulatory Visit: Payer: BLUE CROSS/BLUE SHIELD

## 2019-01-02 ENCOUNTER — Ambulatory Visit: Payer: BLUE CROSS/BLUE SHIELD | Admitting: Physician Assistant

## 2019-01-08 ENCOUNTER — Other Ambulatory Visit: Payer: Self-pay | Admitting: Physician Assistant

## 2019-01-13 ENCOUNTER — Ambulatory Visit
Admission: RE | Admit: 2019-01-13 | Discharge: 2019-01-13 | Disposition: A | Discharge status: Home / Self Care | Payer: BC Managed Care – PPO | Source: Ambulatory Visit | Attending: Nurse Practitioner | Admitting: Nurse Practitioner

## 2019-01-13 ENCOUNTER — Other Ambulatory Visit: Payer: Self-pay

## 2019-01-13 DIAGNOSIS — Z1231 Encounter for screening mammogram for malignant neoplasm of breast: Secondary | ICD-10-CM | POA: Insufficient documentation

## 2019-01-30 ENCOUNTER — Other Ambulatory Visit: Payer: Self-pay

## 2019-01-30 ENCOUNTER — Ambulatory Visit (INDEPENDENT_AMBULATORY_CARE_PROVIDER_SITE_OTHER): Payer: BC Managed Care – PPO | Admitting: Physician Assistant

## 2019-01-30 ENCOUNTER — Encounter: Payer: Self-pay | Admitting: Physician Assistant

## 2019-01-30 DIAGNOSIS — F3342 Major depressive disorder, recurrent, in full remission: Secondary | ICD-10-CM | POA: Diagnosis not present

## 2019-01-30 DIAGNOSIS — F411 Generalized anxiety disorder: Secondary | ICD-10-CM

## 2019-01-30 DIAGNOSIS — F429 Obsessive-compulsive disorder, unspecified: Secondary | ICD-10-CM

## 2019-01-30 MED ORDER — ALPRAZOLAM 0.5 MG PO TABS: ORAL_TABLET | ORAL | 5 refills | Status: DC

## 2019-01-30 MED ORDER — BUPROPION HCL ER (XL) 150 MG PO TB24: 150.0000 mg | ORAL_TABLET | Freq: Every morning | ORAL | 3 refills | Status: DC

## 2019-01-30 NOTE — Progress Notes (Signed)
[facility]  Patient ID: Kelsey Stephenson,  MRN: [id number]  PCP: Sallee Lange, NP  Date of Evaluation: 01/30/2019 Time spent:15 minutes  Chief Complaint:  Chief Complaint    Anxiety; Depression; Follow-up     Virtual Visit via Telephone Note  I connected with patient by a video enabled telemedicine application or telephone, with their informed consent, and verified patient privacy and that I am speaking with the correct person using two identifiers.  I am private, in my office and the patient is home.  I discussed the limitations, risks, security and privacy concerns of performing an evaluation and management service by telephone and the availability of in person appointments. I also discussed with the patient that there may be a patient responsible charge related to this service. The patient expressed understanding and agreed to proceed.   I discussed the assessment and treatment plan with the patient. The patient was provided an opportunity to ask questions and all were answered. The patient agreed with the plan and demonstrated an understanding of the instructions.   The patient was advised to call back or seek an in-person evaluation if the symptoms worsen or if the condition fails to improve as anticipated.  I provided 15 minutes of non-face-to-face time during this encounter.  HISTORY/CURRENT STATUS: HPI For 6 month med check.  Doing well!  Is using Plexus for gut health and weight loss.  He is feeling a lot better physically as well as mentally.  She sleeps well.  She is helping out with her grandkids.  OCD symptoms are well controlled.  Occasionally she will have racing obsessive thoughts but most of the time not.  She is very happy with the way her medicines are working.  Patient denies loss of interest in usual activities and is able to enjoy things.  Denies decreased energy or motivation.  Appetite has not changed.  No extreme sadness, tearfulness,  or feelings of hopelessness.  Denies any changes in concentration, making decisions or remembering things.  Denies suicidal or homicidal thoughts.  Denies dizziness, syncope, seizures, numbness, tingling, tremor, tics, unsteady gait, slurred speech, confusion. Denies muscle or joint pain, stiffness, or dystonia.  Individual Medical History/ Review of Systems: Changes? :No    Past medications for mental health diagnoses include: Paxil, Prozac made her worse, BuSpar increased blood pressure, Celexa, Wellbutrin XL and SR, Xanax, Luvox  Allergies: Patient has no known allergies.  Current Medications:  Current Outpatient Medications:  .  ALPRAZolam (XANAX) 0.5 MG tablet, TAKE 1/2 TO 1 (ONE-HALF TO ONE) TABLET BY MOUTH TWICE DAILY AS NEEDED FOR ANXIETY, Disp: 60 tablet, Rfl: 5 .  buPROPion (WELLBUTRIN XL) 150 MG 24 hr tablet, Take 1 tablet (150 mg total) by mouth every morning., Disp: 90 tablet, Rfl: 3 .  fluvoxaMINE (LUVOX) 100 MG tablet, TAKE 2 TABLETS AT BEDTIME, Disp: 180 tablet, Rfl: 3 .  metoprolol succinate (TOPROL-XL) 50 MG 24 hr tablet, Take 50 mg by mouth daily. Take with or immediately following a meal., Disp: , Rfl:  .  omeprazole (PRILOSEC) 20 MG capsule, Take 20 mg by mouth 2 (two) times daily as needed. , Disp: , Rfl:  .  Probiotic Product (PROBIOTIC PO), Take by mouth., Disp: , Rfl:  Medication Side Effects: none  Family Medical/ Social History: Changes? No  MENTAL HEALTH EXAM:  There were no vitals taken for this visit.There is no height or weight on file to calculate BMI.  General Appearance: unable to assess  Eye Contact:  unable to assess  Speech:  Clear and Coherent  Volume:  Normal  Mood:  Euthymic  Affect:  unable to assess  Thought Process:  Goal Directed and Descriptions of Associations: Intact  Orientation:  Full (Time, Place, and Person)  Thought Content: Logical   Suicidal Thoughts:  No  Homicidal Thoughts:  No  Memory:  WNL  Judgement:  Good  Insight:   Good  Psychomotor Activity:  unable to assess  Concentration:  Concentration: Good  Recall:  Good  Fund of Knowledge: Good  Language: Good  Assets:  Desire for Improvement  ADL's:  Intact  Cognition: WNL  Prognosis:  Good    DIAGNOSES:    ICD-10-CM   1. Generalized anxiety disorder  F41.1   2. Obsessive-compulsive disorder, unspecified type  F42.9   3. Recurrent major depressive disorder, in full remission ([place])  F33.42     Receiving Psychotherapy: No    RECOMMENDATIONS:  PDMP was reviewed. Continue Xanax 0.5 mg, 1/2-1 twice daily as needed. Continue Wellbutrin XL 150 mg every morning. Continue Luvox 100 mg, 2 p.o. nightly. Continue healthy lifestyle choices. Return in 6 months.  Donnal Moat, PA-C

## 2019-07-02 ENCOUNTER — Other Ambulatory Visit: Payer: Self-pay

## 2019-07-02 ENCOUNTER — Ambulatory Visit (INDEPENDENT_AMBULATORY_CARE_PROVIDER_SITE_OTHER): Payer: BC Managed Care – PPO | Admitting: Dermatology

## 2019-07-02 DIAGNOSIS — D1800 Hemangioma unspecified site: Secondary | ICD-10-CM | POA: Diagnosis not present

## 2019-07-02 DIAGNOSIS — L821 Other seborrheic keratosis: Secondary | ICD-10-CM | POA: Diagnosis not present

## 2019-07-02 DIAGNOSIS — D229 Melanocytic nevi, unspecified: Secondary | ICD-10-CM | POA: Diagnosis not present

## 2019-07-02 DIAGNOSIS — D485 Neoplasm of uncertain behavior of skin: Secondary | ICD-10-CM

## 2019-07-02 DIAGNOSIS — L578 Other skin changes due to chronic exposure to nonionizing radiation: Secondary | ICD-10-CM

## 2019-07-02 DIAGNOSIS — D489 Neoplasm of uncertain behavior, unspecified: Secondary | ICD-10-CM

## 2019-07-02 DIAGNOSIS — Z1283 Encounter for screening for malignant neoplasm of skin: Secondary | ICD-10-CM

## 2019-07-02 DIAGNOSIS — D239 Other benign neoplasm of skin, unspecified: Secondary | ICD-10-CM

## 2019-07-02 DIAGNOSIS — L814 Other melanin hyperpigmentation: Secondary | ICD-10-CM

## 2019-07-02 HISTORY — DX: Other benign neoplasm of skin, unspecified: D23.9

## 2019-07-02 NOTE — Progress Notes (Signed)
Follow-Up Visit   Subjective  Kelsey Stephenson is a 54 y.o. female who presents for the following: TBSE (has a dark spot on her left side of neck, came up several weeks ago, no pain or itch, but does get irritated by necklace. ). Patient presents for total body skin examination for skin cancer screening and mole check.  The following portions of the chart were reviewed this encounter and updated as appropriate:  Tobacco  Allergies  Meds  Problems  Med Hx  Surg Hx  Fam Hx      Review of Systems:  No other skin or systemic complaints except as noted in HPI or Assessment and Plan.  Objective  Well appearing patient in no apparent distress; mood and affect are within normal limits.  A full examination was performed including scalp, head, eyes, ears, nose, lips, neck, chest, axillae, abdomen, back, buttocks, bilateral upper extremities, bilateral lower extremities, hands, feet, fingers, toes, fingernails, and toenails. All findings within normal limits unless otherwise noted below.  Objective  Left Lateral Neck: 0.6 cm Pearly papule  Objective  Mid Back Spinal at braline: 1.2cm brown flat papule   Assessment & Plan  Neoplasm of uncertain behavior (2) Left Lateral Neck  Epidermal / dermal shaving  Lesion length (cm):  0.6 Lesion width (cm):  0.6 Margin per side (cm):  0.2 Total excision diameter (cm):  1 Informed consent: discussed and consent obtained   Timeout: patient name, date of birth, surgical site, and procedure verified   Procedure prep:  Patient was prepped and draped in usual sterile fashion Prep type:  Isopropyl alcohol Anesthesia: the lesion was anesthetized in a standard fashion   Anesthetic:  1% lidocaine w/ epinephrine 1-100,000 buffered w/ 8.4% NaHCO3 Instrument used: flexible razor blade   Hemostasis achieved with: pressure, aluminum chloride and electrodesiccation   Outcome: patient tolerated procedure well   Post-procedure details: sterile  dressing applied and wound care instructions given   Dressing type: bandage and petrolatum    Destruction of lesion Complexity: extensive   Destruction method: electrodesiccation and curettage   Informed consent: discussed and consent obtained   Timeout:  patient name, date of birth, surgical site, and procedure verified Procedure prep:  Patient was prepped and draped in usual sterile fashion Prep type:  Isopropyl alcohol Anesthesia: the lesion was anesthetized in a standard fashion   Anesthetic:  1% lidocaine w/ epinephrine 1-100,000 buffered w/ 8.4% NaHCO3 Curettage performed in three different directions: Yes   Electrodesiccation performed over the curetted area: Yes   Lesion length (cm):  0.6 Lesion width (cm):  0.6 Margin per side (cm):  0.2 Final wound size (cm):  1 Hemostasis achieved with:  pressure, aluminum chloride and electrodesiccation Outcome: patient tolerated procedure well with no complications   Post-procedure details: sterile dressing applied and wound care instructions given   Dressing type: bandage and petrolatum    Specimen 1 - Surgical pathology Differential Diagnosis: BCC vs other Check Margins: No 0.6 cm Pearly papule  Mid Back Spinal at braline  Epidermal / dermal shaving  Lesion length (cm):  1.2 Lesion width (cm):  1.2 Margin per side (cm):  0.2 Total excision diameter (cm):  1.6 Informed consent: discussed and consent obtained   Timeout: patient name, date of birth, surgical site, and procedure verified   Procedure prep:  Patient was prepped and draped in usual sterile fashion Prep type:  Isopropyl alcohol Anesthesia: the lesion was anesthetized in a standard fashion   Anesthetic:  1% lidocaine  w/ epinephrine 1-100,000 buffered w/ 8.4% NaHCO3 Instrument used: flexible razor blade   Hemostasis achieved with: pressure, aluminum chloride and electrodesiccation   Outcome: patient tolerated procedure well   Post-procedure details: sterile dressing  applied and wound care instructions given   Dressing type: petrolatum and pressure dressing    Specimen 2 - Surgical pathology Differential Diagnosis: nevus vs dysplastic Check Margins: No 1.2cm brown flat papule  Skin cancer screening  Return in about 1 year (around 07/01/2020), or if symptoms worsen or fail to improve, for TBSE.   Lentigines - Scattered tan macules - Discussed due to sun exposure - Benign, observe - Call for any changes  Seborrheic Keratoses - Stuck-on, waxy, tan-brown papules and plaques  - Discussed benign etiology and prognosis. - Observe - Call for any changes  Melanocytic Nevi - Tan-brown and/or pink-flesh-colored symmetric macules and papules - Benign appearing on exam today - Observation - Call clinic for new or changing moles - Recommend daily use of broad spectrum spf 30+ sunscreen to sun-exposed areas.   Hemangiomas - Red papules - Discussed benign nature - Observe - Call for any changes  Actinic Damage - diffuse scaly erythematous macules with underlying dyspigmentation - Recommend daily broad spectrum sunscreen SPF 30+ to sun-exposed areas, reapply every 2 hours as needed.  - Call for new or changing lesions.  Skin cancer screening performed today.   Marene Lenz, CMA, am acting as scribe for Sarina Ser, MD Documentation: I have reviewed the above documentation for accuracy and completeness, and I agree with the above.  Sarina Ser, MD

## 2019-07-02 NOTE — Patient Instructions (Signed)

## 2019-07-08 ENCOUNTER — Telehealth: Payer: Self-pay

## 2019-07-08 NOTE — Telephone Encounter (Signed)
Discussed biopsy results with pt  °

## 2019-07-08 NOTE — Telephone Encounter (Signed)
LM on VM to return my call. 

## 2019-07-09 ENCOUNTER — Encounter: Payer: Self-pay | Admitting: Dermatology

## 2019-07-14 ENCOUNTER — Telehealth: Payer: Self-pay

## 2019-07-14 NOTE — Telephone Encounter (Signed)
It is a tricky area due to bra rubbing. Keep covered 24/7 with bandaid.  May be still rubbing. Ask if she thinks she is allergic to bandaid or ointment she is using?? We can check her if she feels we need to.

## 2019-07-14 NOTE — Telephone Encounter (Signed)
Patient called regarding biopsy that was done two weeks ago. She states the area is right at her bra line but it is continuing to stay red. No scabbing or signs of healing. She does apply neosporin to the area, keeps it covered during the day and takes the bandages off at night.

## 2019-07-15 ENCOUNTER — Encounter: Payer: Self-pay | Admitting: Dermatology

## 2019-07-15 NOTE — Telephone Encounter (Signed)
Advised patient of information per Dr. Nehemiah Massed. She is going to send in a photo through MyChart to have this place looked at before making appointment to come in.

## 2019-12-10 ENCOUNTER — Encounter: Payer: Self-pay | Admitting: Physician Assistant

## 2019-12-10 ENCOUNTER — Other Ambulatory Visit: Payer: Self-pay

## 2019-12-10 ENCOUNTER — Ambulatory Visit (INDEPENDENT_AMBULATORY_CARE_PROVIDER_SITE_OTHER): Payer: BC Managed Care – PPO | Admitting: Physician Assistant

## 2019-12-10 DIAGNOSIS — F429 Obsessive-compulsive disorder, unspecified: Secondary | ICD-10-CM | POA: Diagnosis not present

## 2019-12-10 DIAGNOSIS — F411 Generalized anxiety disorder: Secondary | ICD-10-CM | POA: Diagnosis not present

## 2019-12-10 DIAGNOSIS — F3342 Major depressive disorder, recurrent, in full remission: Secondary | ICD-10-CM | POA: Diagnosis not present

## 2019-12-10 MED ORDER — ALPRAZOLAM 0.5 MG PO TABS: ORAL_TABLET | ORAL | 5 refills | Status: DC

## 2019-12-10 MED ORDER — BUPROPION HCL ER (XL) 150 MG PO TB24: 150.0000 mg | ORAL_TABLET | Freq: Every morning | ORAL | 1 refills | Status: DC

## 2019-12-10 MED ORDER — FLUVOXAMINE MALEATE 100 MG PO TABS: 200.0000 mg | ORAL_TABLET | Freq: Every day | ORAL | 1 refills | Status: DC

## 2019-12-10 NOTE — Progress Notes (Signed)
[facility]  Patient ID: Kelsey Stephenson,  MRN: [id number]  PCP: Sallee Lange, NP  Date of Evaluation: 12/10/2019 Time spent:20 minutes  Chief Complaint:  Chief Complaint    Anxiety; Medication Refill      HISTORY/CURRENT STATUS: HPI For routine med check.  Has not been seen since last December for several different reasons but has still been on her medication and is doing very well.  She occasionally has obsessive thoughts but she does not stay "stuck" in them.  No compulsions.  Anxiety is well controlled.  She does not need the Xanax very often at all.  Things have been going really well for her.  She helps out with her 3 grandchildren, one is 54, 1 is in early elementary age and the youngest is 73 months.  She enjoys being with them and helping out as much as possible.  Energy and motivation are good.  She is not isolating.  Does not cry easily.  Appetite is normal without weight loss or gain.  Denies suicidal or homicidal thoughts.  She is still using Plexus products and it has helped tremendously with stomach issues that she has had all of her life.  Denies dizziness, syncope, seizures, numbness, tingling, tremor, tics, unsteady gait, slurred speech, confusion. Denies muscle or joint pain, stiffness, or dystonia.  Individual Medical History/ Review of Systems: Changes? :No    Past medications for mental health diagnoses include: Paxil, Prozac made her worse, BuSpar increased blood pressure, Celexa, Wellbutrin XL and SR, Xanax, Luvox  Allergies: Patient has no known allergies.  Current Medications:  Current Outpatient Medications:  .  ALPRAZolam (XANAX) 0.5 MG tablet, TAKE 1/2 TO 1 (ONE-HALF TO ONE) TABLET BY MOUTH TWICE DAILY AS NEEDED FOR ANXIETY, Disp: 60 tablet, Rfl: 5 .  buPROPion (WELLBUTRIN XL) 150 MG 24 hr tablet, Take 1 tablet (150 mg total) by mouth every morning., Disp: 90 tablet, Rfl: 1 .  fluvoxaMINE (LUVOX) 100 MG tablet, Take 2 tablets  (200 mg total) by mouth at bedtime., Disp: 180 tablet, Rfl: 1 .  metoprolol succinate (TOPROL-XL) 50 MG 24 hr tablet, Take 50 mg by mouth daily. Take with or immediately following a meal., Disp: , Rfl:  .  Probiotic Product (PROBIOTIC PO), Take by mouth., Disp: , Rfl:  .  omeprazole (PRILOSEC) 20 MG capsule, Take 20 mg by mouth 2 (two) times daily as needed.  (Patient not taking: Reported on 12/10/2019), Disp: , Rfl:  Medication Side Effects: Excessive sweat that is tolerable  Family Medical/ Social History: Changes? No  MENTAL HEALTH EXAM:  There were no vitals taken for this visit.There is no height or weight on file to calculate BMI.  General Appearance: Casual, Neat and Well Groomed  Eye Contact:  Good  Speech:  Clear and Coherent  Volume:  Normal  Mood:  Euthymic  Affect:  Appropriate  Thought Process:  Goal Directed and Descriptions of Associations: Intact  Orientation:  Full (Time, Place, and Person)  Thought Content: Logical   Suicidal Thoughts:  No  Homicidal Thoughts:  No  Memory:  WNL  Judgement:  Good  Insight:  Good  Psychomotor Activity:  Normal  Concentration:  Concentration: Good  Recall:  Good  Fund of Knowledge: Good  Language: Good  Assets:  Desire for Improvement  ADL's:  Intact  Cognition: WNL  Prognosis:  Good    DIAGNOSES:    ICD-10-CM   1. Obsessive-compulsive disorder, unspecified type  F42.9   2. Recurrent major depressive  disorder, in full remission ([place])  F33.42   3. Generalized anxiety disorder  F41.1     Receiving Psychotherapy: No    RECOMMENDATIONS:  PDMP was reviewed. I provided 20 minutes of face-to-face time during this encounter. I am glad to see her doing so well! Continue Xanax 0.5 mg, 1/2-1 twice daily as needed. Continue Wellbutrin XL 150 mg every morning. Continue Luvox 100 mg, 2 p.o. nightly. Continue healthy lifestyle choices. Return in 6 months.  Donnal Moat, PA-C

## 2019-12-25 ENCOUNTER — Other Ambulatory Visit: Payer: Self-pay | Admitting: Nurse Practitioner

## 2019-12-25 DIAGNOSIS — Z1231 Encounter for screening mammogram for malignant neoplasm of breast: Secondary | ICD-10-CM

## 2020-02-03 ENCOUNTER — Other Ambulatory Visit: Payer: Self-pay

## 2020-02-03 ENCOUNTER — Ambulatory Visit
Admission: RE | Admit: 2020-02-03 | Discharge: 2020-02-03 | Disposition: A | Discharge status: Home / Self Care | Payer: BC Managed Care – PPO | Source: Ambulatory Visit | Attending: Nurse Practitioner | Admitting: Nurse Practitioner

## 2020-02-03 DIAGNOSIS — Z1231 Encounter for screening mammogram for malignant neoplasm of breast: Secondary | ICD-10-CM | POA: Insufficient documentation

## 2020-06-08 ENCOUNTER — Other Ambulatory Visit: Payer: Self-pay | Admitting: Physician Assistant

## 2020-06-09 NOTE — Telephone Encounter (Signed)
She has an appt on 08/10/20

## 2020-06-09 NOTE — Telephone Encounter (Signed)
Please schedule appt

## 2020-06-30 ENCOUNTER — Other Ambulatory Visit: Payer: Self-pay | Admitting: Physician Assistant

## 2020-07-01 ENCOUNTER — Other Ambulatory Visit: Payer: Self-pay | Admitting: Physician Assistant

## 2020-07-02 ENCOUNTER — Other Ambulatory Visit: Payer: Self-pay | Admitting: Physician Assistant

## 2020-07-05 ENCOUNTER — Ambulatory Visit: Payer: BC Managed Care – PPO | Admitting: Dermatology

## 2020-08-10 ENCOUNTER — Telehealth (INDEPENDENT_AMBULATORY_CARE_PROVIDER_SITE_OTHER): Payer: BC Managed Care – PPO | Admitting: Physician Assistant

## 2020-08-10 ENCOUNTER — Encounter: Payer: Self-pay | Admitting: Physician Assistant

## 2020-08-10 DIAGNOSIS — F429 Obsessive-compulsive disorder, unspecified: Secondary | ICD-10-CM

## 2020-08-10 DIAGNOSIS — F3342 Major depressive disorder, recurrent, in full remission: Secondary | ICD-10-CM

## 2020-08-10 DIAGNOSIS — F411 Generalized anxiety disorder: Secondary | ICD-10-CM | POA: Diagnosis not present

## 2020-08-10 MED ORDER — FLUVOXAMINE MALEATE 100 MG PO TABS: 200.0000 mg | ORAL_TABLET | Freq: Every day | ORAL | 1 refills | Status: DC

## 2020-08-10 MED ORDER — BUPROPION HCL ER (XL) 150 MG PO TB24: 1.0000 | ORAL_TABLET | Freq: Every morning | ORAL | 1 refills | Status: DC

## 2020-08-10 MED ORDER — ALPRAZOLAM 0.5 MG PO TABS: ORAL_TABLET | ORAL | 5 refills | Status: DC

## 2020-08-10 NOTE — Progress Notes (Signed)
[facility]  Patient ID: Kelsey Stephenson,  MRN: [id number]  PCP: Sallee Lange, NP  Date of Evaluation: 08/10/2020 Time spent:25 minutes  Chief Complaint:  Chief Complaint   Anxiety; Depression; Follow-up    Virtual Visit via Telehealth  I connected with patient by telephone, with their informed consent, and verified patient privacy and that I am speaking with the correct person using two identifiers.  I am private, in my office and the patient is at home.  I discussed the limitations, risks, security and privacy concerns of performing an evaluation and management service by telephone and the availability of in person appointments. I also discussed with the patient that there may be a patient responsible charge related to this service. The patient expressed understanding and agreed to proceed.   I discussed the assessment and treatment plan with the patient. The patient was provided an opportunity to ask questions and all were answered. The patient agreed with the plan and demonstrated an understanding of the instructions.   The patient was advised to call back or seek an in-person evaluation if the symptoms worsen or if the condition fails to improve as anticipated.  I provided 25 minutes of non-face-to-face time during this encounter.  HISTORY/CURRENT STATUS: HPI For routine med check.  Doing well with her meds.  Gets a little anxious and uptight sometimes due to menopause, but otherwise she is doing well.  She feels like the medications are still working.  She has been stable for a long time now and wonders if she still needs to be on these meds. States she's not looking to make any changes, she's just wondering.   She is able to enjoy things.  Denies decreased energy or motivation.  Appetite has not changed.  No extreme sadness, tearfulness, or feelings of hopelessness.  Denies any changes in concentration, making decisions or remembering things.  She sleeps  well most of the time.  Denies suicidal or homicidal thoughts.  Patient denies increased energy with decreased need for sleep, no increased talkativeness, no racing thoughts, no impulsivity or risky behaviors, no increased spending, no increased libido, no grandiosity, no increased irritability or anger, and no hallucinations.  Denies dizziness, syncope, seizures, numbness, tingling, tremor, tics, unsteady gait, slurred speech, confusion. Denies muscle or joint pain, stiffness, or dystonia.  Individual Medical History/ Review of Systems: Changes? :No    Past medications for mental health diagnoses include: Paxil, Prozac made her worse, BuSpar increased blood pressure, Celexa, Wellbutrin XL and SR, Xanax, Luvox  Allergies: Patient has no known allergies.  Current Medications:  Current Outpatient Medications:    metoprolol succinate (TOPROL-XL) 50 MG 24 hr tablet, Take 50 mg by mouth daily. Take with or immediately following a meal., Disp: , Rfl:    Probiotic Product (PROBIOTIC PO), Take by mouth., Disp: , Rfl:    ALPRAZolam (XANAX) 0.5 MG tablet, TAKE 1/2 (ONE-HALF) TO 1 TABLET BY MOUTH TWICE DAILY AS NEEDED FOR ANXIETY, Disp: 60 tablet, Rfl: 5   buPROPion (WELLBUTRIN XL) 150 MG 24 hr tablet, Take 1 tablet (150 mg total) by mouth every morning., Disp: 90 tablet, Rfl: 1   fluvoxaMINE (LUVOX) 100 MG tablet, Take 2 tablets (200 mg total) by mouth at bedtime., Disp: 180 tablet, Rfl: 1   omeprazole (PRILOSEC) 20 MG capsule, Take 20 mg by mouth 2 (two) times daily as needed.  (Patient not taking: No sig reported), Disp: , Rfl:  Medication Side Effects:  Excessive sweat that is tolerable  Family  Medical/ Social History: Changes? No  MENTAL HEALTH EXAM:  There were no vitals taken for this visit.There is no height or weight on file to calculate BMI.  General Appearance:  Unable to assess  Eye Contact:   Unable to assess  Speech:  Clear and Coherent  Volume:  Normal  Mood:  Euthymic  Affect:    unable to assess  Thought Process:  Goal Directed and Descriptions of Associations: Intact  Orientation:  Full (Time, Place, and Person)  Thought Content: Logical   Suicidal Thoughts:  No  Homicidal Thoughts:  No  Memory:  WNL  Judgement:  Good  Insight:  Good  Psychomotor Activity:   Unable to assess  Concentration:  Concentration: Good  Recall:  Good  Fund of Knowledge: Good  Language: Good  Assets:  Desire for Improvement  ADL's:  Intact  Cognition: WNL  Prognosis:  Good    DIAGNOSES:    ICD-10-CM   1. Obsessive-compulsive disorder, unspecified type  F42.9     2. Generalized anxiety disorder  F41.1     3. Recurrent major depressive disorder, in full remission ([place])  F33.42        Receiving Psychotherapy: No    RECOMMENDATIONS:  PDMP was reviewed. I provided 25 minutes of non-face-to-face time during this encounter, including time spent before and after the visit in records review, medical decision making, and charting.  I am glad to see her doing so well!  We discussed whether it would be a good idea to get off of the Luvox or Wellbutrin.  They both seem to be helping with anxiety, the OCD and giving her some pep so I do not recommend changing it at this time.  If she decides that she would like to try, she can call and I can decrease Luvox.  Continue Xanax 0.5 mg, 1/2-1 twice daily as needed. Continue Wellbutrin XL 150 mg every morning. Continue Luvox 100 mg, 2 p.o. nightly. Continue healthy lifestyle choices. Return in 6 months.  Donnal Moat, PA-C

## 2020-08-12 ENCOUNTER — Ambulatory Visit: Payer: BC Managed Care – PPO | Admitting: Physician Assistant

## 2021-03-07 ENCOUNTER — Other Ambulatory Visit: Payer: Self-pay | Admitting: Physician Assistant

## 2021-03-07 NOTE — Telephone Encounter (Signed)
Please call patient to schedule an appt. She was last seen 6/21 with 6-mo F/U recommended.

## 2021-03-09 NOTE — Telephone Encounter (Signed)
Pt has an appt 2/13

## 2021-03-14 ENCOUNTER — Other Ambulatory Visit: Payer: Self-pay | Admitting: Physician Assistant

## 2021-04-04 ENCOUNTER — Encounter: Payer: Self-pay | Admitting: Physician Assistant

## 2021-04-04 ENCOUNTER — Other Ambulatory Visit: Payer: Self-pay | Admitting: Nurse Practitioner

## 2021-04-04 ENCOUNTER — Ambulatory Visit (INDEPENDENT_AMBULATORY_CARE_PROVIDER_SITE_OTHER): Payer: BC Managed Care – PPO | Admitting: Physician Assistant

## 2021-04-04 DIAGNOSIS — F411 Generalized anxiety disorder: Secondary | ICD-10-CM

## 2021-04-04 DIAGNOSIS — F429 Obsessive-compulsive disorder, unspecified: Secondary | ICD-10-CM

## 2021-04-04 DIAGNOSIS — F3342 Major depressive disorder, recurrent, in full remission: Secondary | ICD-10-CM

## 2021-04-04 DIAGNOSIS — Z1231 Encounter for screening mammogram for malignant neoplasm of breast: Secondary | ICD-10-CM

## 2021-04-04 MED ORDER — ALPRAZOLAM 0.5 MG PO TABS: ORAL_TABLET | ORAL | 5 refills | Status: DC

## 2021-04-04 NOTE — Progress Notes (Signed)
[facility]  Patient ID: Kelsey Stephenson,  MRN: [id number]  PCP: Sallee Lange, NP  Date of Evaluation: 04/04/2021 Time spent:25 minutes  Chief Complaint:  Chief Complaint   Anxiety; Depression; Insomnia; Follow-up    Virtual Visit via Telehealth  I connected with patient by telephone, with their informed consent, and verified patient privacy and that I am speaking with the correct person using two identifiers.  I am private, in my office and the patient is at home.  I discussed the limitations, risks, security and privacy concerns of performing an evaluation and management service by telephone and the availability of in person appointments. I also discussed with the patient that there may be a patient responsible charge related to this service. The patient expressed understanding and agreed to proceed.   I discussed the assessment and treatment plan with the patient. The patient was provided an opportunity to ask questions and all were answered. The patient agreed with the plan and demonstrated an understanding of the instructions.   The patient was advised to call back or seek an in-person evaluation if the symptoms worsen or if the condition fails to improve as anticipated.  I provided 25 minutes of non-face-to-face time during this encounter.  HISTORY/CURRENT STATUS: HPI For routine med check.  Patient denies loss of interest in usual activities and is able to enjoy things.  Denies decreased energy or motivation.  Appetite has not changed.  No extreme sadness, tearfulness, or feelings of hopelessness.  Denies any changes in concentration, making decisions or remembering things. Denies suicidal or homicidal thoughts.  Does have anxiety at times, ruminates and worries more about her health than anything else.  She was having palpitations for example and mentioned it in passing with her PCP and she had a stress test that was normal, which gave her a lot of peace  of mind.  Most of the time she is able to work through any specific worries, states that having definitive results is very helpful to ease her mind though.  She occasionally takes a Xanax but not very often.  Sleeps well 'sometimes.' Has a hard time falling asleep occasionally. 2-3 times per week. Will take Xanax sometimes and it helps her fall asleep. Once she's asleep, she staysasleep. No caffeine except 1 occasional coffee early in the mornings.  Patient denies increased energy with decreased need for sleep, no increased talkativeness, no racing thoughts, no impulsivity or risky behaviors, no increased spending, no increased libido, no grandiosity, no increased irritability or anger, and no hallucinations.  Denies dizziness, syncope, seizures, numbness, tingling, tremor, tics, unsteady gait, slurred speech, confusion. Denies muscle or joint pain, stiffness, or dystonia.  Individual Medical History/ Review of Systems: Changes? :Yes   see HPI, Care Everywhere by her cardiologist at [facility]  Past medications for mental health diagnoses include: Paxil, Prozac made her worse, BuSpar increased blood pressure, Celexa, Wellbutrin XL and SR, Xanax, Luvox  Allergies: Patient has no known allergies.  Current Medications:  Current Outpatient Medications:    buPROPion (WELLBUTRIN XL) 150 MG 24 hr tablet, TAKE 1 TABLET EVERY MORNING, Disp: 90 tablet, Rfl: 3   fluvoxaMINE (LUVOX) 100 MG tablet, TAKE 2 TABLETS AT BEDTIME, Disp: 180 tablet, Rfl: 0   metoprolol succinate (TOPROL-XL) 50 MG 24 hr tablet, Take 50 mg by mouth daily. Take with or immediately following a meal., Disp: , Rfl:    Probiotic Product (PROBIOTIC PO), Take by mouth., Disp: , Rfl:    ALPRAZolam (XANAX) 0.5 MG  tablet, TAKE 1/2 (ONE-HALF) TO 1 TABLET BY MOUTH TWICE DAILY AS NEEDED FOR ANXIETY, Disp: 60 tablet, Rfl: 5   omeprazole (PRILOSEC) 20 MG capsule, Take 20 mg by mouth 2 (two) times daily as needed.  (Patient not taking: Reported on  12/10/2019), Disp: , Rfl:  Medication Side Effects: none  Family Medical/ Social History: Changes?  Is in the process of moving her elderly parents here from [place].  MENTAL HEALTH EXAM:  There were no vitals taken for this visit.There is no height or weight on file to calculate BMI.  General Appearance:  Unable to assess  Eye Contact:   Unable to assess  Speech:  Clear and Coherent  Volume:  Normal  Mood:  Euthymic  Affect:   unable to assess  Thought Process:  Goal Directed and Descriptions of Associations: Circumstantial  Orientation:  Full (Time, Place, and Person)  Thought Content: Logical   Suicidal Thoughts:  No  Homicidal Thoughts:  No  Memory:  WNL  Judgement:  Good  Insight:  Good  Psychomotor Activity:   Unable to assess  Concentration:  Concentration: Good  Recall:  Good  Fund of Knowledge: Good  Language: Good  Assets:  Desire for Improvement  ADL's:  Intact  Cognition: WNL  Prognosis:  Good    DIAGNOSES:    ICD-10-CM   1. Obsessive-compulsive disorder, unspecified type  F42.9     2. Generalized anxiety disorder  F41.1     3. Recurrent major depressive disorder, in full remission ([employer])  F33.42         Receiving Psychotherapy: No    RECOMMENDATIONS:  PDMP was reviewed.  Last Xanax filled 12/28/2020. I provided 25 minutes of non-face-to-face time during this encounter, including time spent before and after the visit in records review, medical decision making, counseling pertinent to today's visit, and charting.  I am glad to see her doing so well! No med changes are needed.   Continue Xanax 0.5 mg, 1/2-1 twice daily as needed. Continue Wellbutrin XL 150 mg every morning. Continue Luvox 100 mg, 2 p.o. nightly. Continue healthy lifestyle choices. Return in 6 months.  Donnal Moat, PA-C

## 2021-05-12 ENCOUNTER — Ambulatory Visit
Admission: RE | Admit: 2021-05-12 | Discharge: 2021-05-12 | Disposition: A | Discharge status: Home / Self Care | Payer: BC Managed Care – PPO | Source: Ambulatory Visit | Attending: Nurse Practitioner | Admitting: Nurse Practitioner

## 2021-05-12 ENCOUNTER — Other Ambulatory Visit: Payer: Self-pay

## 2021-05-12 DIAGNOSIS — Z1231 Encounter for screening mammogram for malignant neoplasm of breast: Secondary | ICD-10-CM | POA: Diagnosis not present

## 2021-06-13 ENCOUNTER — Other Ambulatory Visit: Payer: Self-pay | Admitting: Physician Assistant

## 2021-10-18 ENCOUNTER — Ambulatory Visit
Admission: EM | Admit: 2021-10-18 | Discharge: 2021-10-18 | Disposition: A | Discharge status: Home / Self Care | Payer: BC Managed Care – PPO | Attending: Physician Assistant | Admitting: Physician Assistant

## 2021-10-18 ENCOUNTER — Other Ambulatory Visit: Payer: Self-pay

## 2021-10-18 ENCOUNTER — Encounter: Payer: Self-pay | Admitting: Emergency Medicine

## 2021-10-18 DIAGNOSIS — R350 Frequency of micturition: Secondary | ICD-10-CM | POA: Diagnosis present

## 2021-10-18 LAB — URINALYSIS, ROUTINE W REFLEX MICROSCOPIC
Bilirubin Urine: NEGATIVE
Glucose, UA: NEGATIVE mg/dL
Ketones, ur: NEGATIVE mg/dL
Leukocytes,Ua: NEGATIVE
Nitrite: NEGATIVE
Protein, ur: NEGATIVE mg/dL
Specific Gravity, Urine: 1.02 (ref 1.005–1.030)
pH: 7 (ref 5.0–8.0)

## 2021-10-18 LAB — URINALYSIS, MICROSCOPIC (REFLEX)

## 2021-10-18 NOTE — Discharge Instructions (Signed)
-  No evidence of UTI today -Return or follow up with clinic if painful urination, fever, flank pain/abdominal pain, etc.

## 2021-10-18 NOTE — ED Triage Notes (Signed)
Pt c/o urinary frequency. Started about 3 days ago. She states she is about to leaves to go out of the country and wants to make sure she does not have a UTI before she leaves. Denies dysuria.

## 2021-10-18 NOTE — ED Provider Notes (Signed)
[facility]    CSN: [id number] Arrival date & time: 10/18/21  1001      History   Chief Complaint Chief Complaint  Patient presents with   Urinary Frequency    HPI Kelsey Stephenson is a 56 y.o. female presenting for urinary frequency for the past couple days.  She denies dysuria, abdominal pain, flank pain, hematuria, vaginal discharge or odor.  Denies fever, chills.  Patient reports that she is going on a [place] cruise to [place] tomorrow and she wants to be sure she does not have a UTI before she leaves.  She denies any significant history for UTIs.  She does report that she has been drinking a lot of fluids.  She has no other complaints.  HPI  Past Medical History:  Diagnosis Date   Dysplastic nevus 07/02/2019   mid back spinal at [facility] - moderate   Family history of adverse reaction to anesthesia    Mother - PONV   GERD (gastroesophageal reflux disease)    Hypertension    Vertigo    Vertigo    last episode 2-3 mos ago    Patient Active Problem List   Diagnosis Date Noted   OCD (obsessive compulsive disorder) 12/16/2017   MDD (major depressive disorder) 12/16/2017   GAD (generalized anxiety disorder) 12/16/2017    Past Surgical History:  Procedure Laterality Date   ABDOMINAL HYSTERECTOMY     BILATERAL OOPHORECTOMY     CORRECTION OVERLAPPING TOES Left 12/06/2016   Procedure: CORRECTION OVERLAPPING TOES-2ND TOE;  Surgeon: [name], DPM;  Location: [place];  Service: Podiatry;  Laterality: Left;   EXCISION MORTON'S NEUROMA Left 12/06/2016   Procedure: EXCISION MORTON'S NEUROMA;  Surgeon: [name], DPM;  Location: [place];  Service: Podiatry;  Laterality: Left;    OB History   No obstetric history on file.      Home Medications    Prior to Admission medications   Medication Sig Start Date End Date Taking? Authorizing Provider  ALPRAZolam Duanne Moron) 0.5 MG tablet TAKE 1/2 (ONE-HALF) TO 1 TABLET BY MOUTH  TWICE DAILY AS NEEDED FOR ANXIETY 04/04/21  Yes Hurst, Teresa T, PA-C  buPROPion (WELLBUTRIN XL) 150 MG 24 hr tablet TAKE 1 TABLET EVERY MORNING 03/09/21  Yes Hurst, Teresa T, PA-C  fluvoxaMINE (LUVOX) 100 MG tablet TAKE 2 TABLETS AT BEDTIME 06/13/21  Yes Hurst, Teresa T, PA-C  metoprolol succinate (TOPROL-XL) 50 MG 24 hr tablet Take 50 mg by mouth daily. Take with or immediately following a meal.   Yes [provider]  Probiotic Product (PROBIOTIC PO) Take by mouth.   Yes [provider]  omeprazole (PRILOSEC) 20 MG capsule Take 20 mg by mouth 2 (two) times daily as needed.  Patient not taking: Reported on 12/10/2019    [provider]    Family History Family History  Problem Relation Age of Onset   Hypertension Father    Breast cancer Neg Hx     Social History Social History   Tobacco Use   Smoking status: Never   Smokeless tobacco: Never  Vaping Use   Vaping Use: Never used  Substance Use Topics   Alcohol use: No   Drug use: No     Allergies   Patient has no known allergies.   Review of Systems Review of Systems  Constitutional:  Negative for chills and fever.  Gastrointestinal:  Negative for abdominal pain, diarrhea, nausea and vomiting.  Genitourinary:  Positive for frequency. Negative for decreased  urine volume, dysuria, flank pain, hematuria, pelvic pain, urgency, vaginal bleeding, vaginal discharge and vaginal pain.  Musculoskeletal:  Negative for back pain.  Skin:  Negative for rash.     Physical Exam Triage Vital Signs ED Triage Vitals  Enc Vitals Group     BP      Pulse      Resp      Temp      Temp src      SpO2      Weight      Height      Head Circumference      Peak Flow      Pain Score      Pain Loc      Pain Edu?      Excl. in [place]?    No data found.  Updated Vital Signs BP 130/87 (BP Location: Left Arm)   Pulse 79   Temp 98.8 F (37.1 C) (Oral)   Resp 16   Ht '5\' 7"'$  (1.702 m)   Wt 179 lb 14.3 oz (81.6 kg)    SpO2 98%   BMI 28.18 kg/m      Physical Exam Vitals and nursing note reviewed.  Constitutional:      General: She is not in acute distress.    Appearance: Normal appearance. She is not ill-appearing or toxic-appearing.  HENT:     Head: Normocephalic and atraumatic.  Eyes:     General: No scleral icterus.       Right eye: No discharge.        Left eye: No discharge.     Conjunctiva/sclera: Conjunctivae normal.  Cardiovascular:     Rate and Rhythm: Normal rate and regular rhythm.     Heart sounds: Normal heart sounds.  Pulmonary:     Effort: Pulmonary effort is normal. No respiratory distress.     Breath sounds: Normal breath sounds.  Abdominal:     Palpations: Abdomen is soft.     Tenderness: There is no abdominal tenderness. There is no right CVA tenderness or left CVA tenderness.  Musculoskeletal:     Cervical back: Neck supple.  Skin:    General: Skin is dry.  Neurological:     General: No focal deficit present.     Mental Status: She is alert. Mental status is at baseline.     Motor: No weakness.     Gait: Gait normal.  Psychiatric:        Mood and Affect: Mood normal.        Behavior: Behavior normal.        Thought Content: Thought content normal.      UC Treatments / Results  Labs (all labs ordered are listed, but only abnormal results are displayed) Labs Reviewed  URINALYSIS, ROUTINE W REFLEX MICROSCOPIC - Abnormal; Notable for the following components:      Result Value   Hgb urine dipstick TRACE (*)    All other components within normal limits  URINALYSIS, MICROSCOPIC (REFLEX) - Abnormal; Notable for the following components:   Bacteria, UA FEW (*)    All other components within normal limits    EKG   Radiology No results found.  Procedures Procedures (including critical care time)  Medications Ordered in UC Medications - No data to display  Initial Impression / Assessment and Plan / UC Course  I have reviewed the triage vital signs and  the nursing notes.  Pertinent labs & imaging results that were available during my care of  the patient were reviewed by me and considered in my medical decision making (see chart for details).   56 year old female presenting for urinary frequency for the past few days.  No dysuria, abdominal/flank pain, vaginal discharge or odor.  No fever or chills.  Vitals are stable exam is normal.  Urinalysis shows trace hemoglobin, otherwise normal.  Discussed results of UA with patient.  Advised her there is no sign of a UTI today.  Encouraged her to stay hydrated.  Advised her to monitor symptoms and if she does develop symptoms to follow-up with the clinic.  Final Clinical Impressions(s) / UC Diagnoses   Final diagnoses:  Urinary frequency     Discharge Instructions      -No evidence of UTI today -Return or follow up with clinic if painful urination, fever, flank pain/abdominal pain, etc.     ED Prescriptions   None    PDMP not reviewed this encounter.   Danton Clap, PA-C 10/18/21 1103

## 2021-10-19 ENCOUNTER — Ambulatory Visit: Payer: Self-pay

## 2021-12-12 ENCOUNTER — Other Ambulatory Visit: Payer: Self-pay | Admitting: Physician Assistant

## 2021-12-22 ENCOUNTER — Encounter: Payer: Self-pay | Admitting: Physician Assistant

## 2021-12-22 ENCOUNTER — Ambulatory Visit (INDEPENDENT_AMBULATORY_CARE_PROVIDER_SITE_OTHER): Payer: BC Managed Care – PPO | Admitting: Physician Assistant

## 2021-12-22 DIAGNOSIS — F3342 Major depressive disorder, recurrent, in full remission: Secondary | ICD-10-CM

## 2021-12-22 DIAGNOSIS — F411 Generalized anxiety disorder: Secondary | ICD-10-CM

## 2021-12-22 DIAGNOSIS — F429 Obsessive-compulsive disorder, unspecified: Secondary | ICD-10-CM

## 2021-12-22 MED ORDER — FLUVOXAMINE MALEATE 100 MG PO TABS: 150.0000 mg | ORAL_TABLET | Freq: Every day | ORAL | 1 refills | Status: DC

## 2021-12-22 MED ORDER — BUPROPION HCL ER (XL) 150 MG PO TB24: 150.0000 mg | ORAL_TABLET | Freq: Every morning | ORAL | 1 refills | Status: DC

## 2021-12-22 MED ORDER — ALPRAZOLAM 0.5 MG PO TABS
ORAL_TABLET | ORAL | 5 refills | Status: DC
Start: 2021-12-22 — End: 2022-06-30

## 2021-12-22 NOTE — Progress Notes (Signed)
[facility]  Patient ID: Kelsey Stephenson,  MRN: [id number]  PCP: Sallee Lange, NP  Date of Evaluation: 12/22/2021 Time spent:20 minutes  Chief Complaint:  Chief Complaint   Anxiety; Depression; Follow-up    HISTORY/CURRENT STATUS: HPI For routine med check.  Doing really well with her mental health. Patient is able to enjoy things.  Energy and motivation are good.  She does not work outside the home but does help out with her grandchildren.  She loves being able to do that.  No extreme sadness, tearfulness, or feelings of hopelessness.  Sleeps well most of the time. ADLs and personal hygiene are normal.   Denies any changes in concentration, making decisions, or remembering things.  Appetite has not changed.  Weight is stable.  Not isolating.  Denies suicidal or homicidal thoughts.  She still gets anxious at times, definitely situationally.  Not having panic attacks but more of a sense of generalized unease, like something bad may happen.  When it gets really bad she takes the Xanax and that helps.  Denies dizziness, syncope, seizures, numbness, tingling, tremor, tics, unsteady gait, slurred speech, confusion. Denies muscle or joint pain, stiffness, or dystonia.  Individual Medical History/ Review of Systems: Changes? :Yes   see HPI, Care Everywhere by her cardiologist at [facility].  Past medications for mental health diagnoses include: Paxil, Prozac made her worse, BuSpar increased blood pressure, Celexa, Wellbutrin XL and SR, Xanax, Luvox  Allergies: Patient has no known allergies.  Current Medications:  Current Outpatient Medications:    metoprolol succinate (TOPROL-XL) 50 MG 24 hr tablet, Take 50 mg by mouth daily. Take with or immediately following a meal., Disp: , Rfl:    Multiple Vitamin (MULTIVITAMIN) tablet, Take 1 tablet by mouth daily., Disp: , Rfl:    Probiotic Product (PROBIOTIC PO), Take by mouth., Disp: , Rfl:    ALPRAZolam (XANAX) 0.5 MG tablet,  TAKE 1/2 (ONE-HALF) TO 1 TABLET BY MOUTH TWICE DAILY AS NEEDED FOR ANXIETY, Disp: 60 tablet, Rfl: 5   buPROPion (WELLBUTRIN XL) 150 MG 24 hr tablet, Take 1 tablet (150 mg total) by mouth every morning., Disp: 90 tablet, Rfl: 1   fluvoxaMINE (LUVOX) 100 MG tablet, Take 1.5 tablets (150 mg total) by mouth at bedtime., Disp: 135 tablet, Rfl: 1 Medication Side Effects: none  Family Medical/ Social History: Changes? No   MENTAL HEALTH EXAM:  There were no vitals taken for this visit.There is no height or weight on file to calculate BMI.  General Appearance: Casual and Well Groomed  Eye Contact:  Good  Speech:  Clear and Coherent  Volume:  Normal  Mood:  Euthymic  Affect:  Congruent  Thought Process:  Goal Directed and Descriptions of Associations: Circumstantial  Orientation:  Full (Time, Place, and Person)  Thought Content: Logical   Suicidal Thoughts:  No  Homicidal Thoughts:  No  Memory:  WNL  Judgement:  Good  Insight:  Good  Psychomotor Activity:  Normal  Concentration:  Concentration: Good  Recall:  Good  Fund of Knowledge: Good  Language: Good  Assets:  Desire for Improvement  ADL's:  Intact  Cognition: WNL  Prognosis:  Good   DIAGNOSES:    ICD-10-CM   1. Obsessive-compulsive disorder, unspecified type  F42.9     2. Generalized anxiety disorder  F41.1     3. Recurrent major depressive disorder, in full remission ([place])  F33.42       Receiving Psychotherapy: No   RECOMMENDATIONS:  PDMP was reviewed.  Xanax 08/02/2021. I provided 20 minutes of face to face time during this encounter, including time spent before and after the visit in records review, medical decision making, counseling pertinent to today's visit, and charting.   I am glad to see her doing well!  No change in treatment needed.  Continue Xanax 0.5 mg, 1/2-1 twice daily as needed. Continue Wellbutrin XL 150 mg every morning. Continue Luvox 100 mg, 1.5 pills  p.o. nightly. Continue healthy lifestyle  choices. Return in 6 months.  Donnal Moat, PA-C

## 2022-03-10 ENCOUNTER — Other Ambulatory Visit: Payer: Self-pay | Admitting: Physician Assistant

## 2022-03-17 ENCOUNTER — Other Ambulatory Visit: Payer: Self-pay

## 2022-03-17 ENCOUNTER — Telehealth: Payer: Self-pay | Admitting: Physician Assistant

## 2022-03-17 MED ORDER — BUPROPION HCL ER (XL) 150 MG PO TB24: 150.0000 mg | ORAL_TABLET | Freq: Every morning | ORAL | 1 refills | Status: DC

## 2022-03-17 NOTE — Telephone Encounter (Signed)
Rx sent 

## 2022-03-17 NOTE — Telephone Encounter (Signed)
Kelsey Stephenson requesting a refill on the generic Wellbutrin. The Rx was voided earlier due to insurance. A new Rx is needed. Fill at [facility]. Patient is requesting a call to confirm Rx was sent. # [phone]

## 2022-04-10 ENCOUNTER — Other Ambulatory Visit: Payer: Self-pay | Admitting: Physician Assistant

## 2022-04-10 DIAGNOSIS — E7800 Pure hypercholesterolemia, unspecified: Secondary | ICD-10-CM

## 2022-04-10 DIAGNOSIS — Z8489 Family history of other specified conditions: Secondary | ICD-10-CM

## 2022-04-10 DIAGNOSIS — R0609 Other forms of dyspnea: Secondary | ICD-10-CM

## 2022-04-10 DIAGNOSIS — I10 Essential (primary) hypertension: Secondary | ICD-10-CM

## 2022-05-02 ENCOUNTER — Other Ambulatory Visit (HOSPITAL_COMMUNITY): Payer: Self-pay | Admitting: *Deleted

## 2022-05-02 ENCOUNTER — Telehealth (HOSPITAL_COMMUNITY): Payer: Self-pay | Admitting: *Deleted

## 2022-05-02 MED ORDER — IVABRADINE HCL 7.5 MG PO TABS
ORAL_TABLET | ORAL | 0 refills | Status: AC
Start: 2022-05-02 — End: ?

## 2022-05-02 MED ORDER — METOPROLOL TARTRATE 100 MG PO TABS: ORAL_TABLET | ORAL | 0 refills | Status: AC

## 2022-05-02 MED ORDER — IVABRADINE HCL 7.5 MG PO TABS: ORAL_TABLET | ORAL | 0 refills | Status: DC

## 2022-05-02 MED ORDER — METOPROLOL TARTRATE 100 MG PO TABS: ORAL_TABLET | ORAL | 0 refills | Status: DC

## 2022-05-02 NOTE — Telephone Encounter (Signed)
Reaching out to patient to offer assistance regarding upcoming cardiac imaging study; pt verbalizes understanding of appt date/time, parking situation and where to check in, pre-test NPO status and medications ordered, and verified current allergies; name and call back number provided for further questions should they arise  Gordy Clement RN [facility] [phone] office [phone] cell  Patient to hold her daily metoprolol succinate and take '100mg'$  metoprolol tartrate and '15mg'$  ivabradine.

## 2022-05-04 ENCOUNTER — Ambulatory Visit
Admission: RE | Admit: 2022-05-04 | Discharge: 2022-05-04 | Disposition: A | Discharge status: Home / Self Care | Payer: BC Managed Care – PPO | Source: Ambulatory Visit | Attending: Physician Assistant | Admitting: Physician Assistant

## 2022-05-04 DIAGNOSIS — E7800 Pure hypercholesterolemia, unspecified: Secondary | ICD-10-CM | POA: Insufficient documentation

## 2022-05-04 DIAGNOSIS — Z8489 Family history of other specified conditions: Secondary | ICD-10-CM | POA: Insufficient documentation

## 2022-05-04 DIAGNOSIS — R0609 Other forms of dyspnea: Secondary | ICD-10-CM

## 2022-05-04 DIAGNOSIS — I10 Essential (primary) hypertension: Secondary | ICD-10-CM | POA: Diagnosis present

## 2022-05-04 MED ORDER — IOHEXOL 350 MG/ML SOLN
100.0000 mL | Freq: Once | INTRAVENOUS | Status: AC | PRN
  Administered 2022-05-04: 100 mL via INTRAVENOUS

## 2022-05-04 MED ORDER — NITROGLYCERIN 0.4 MG SL SUBL
0.8000 mg | SUBLINGUAL_TABLET | Freq: Once | SUBLINGUAL | Status: AC
  Administered 2022-05-04: 0.8 mg via SUBLINGUAL

## 2022-05-04 NOTE — Progress Notes (Signed)
Patient tolerated CT well. Drank water after. Vital signs stable encourage to drink water throughout day.Reasons explained and verbalized understanding. Ambulated steady gait.  

## 2022-06-06 ENCOUNTER — Other Ambulatory Visit: Payer: Self-pay | Admitting: Nurse Practitioner

## 2022-06-06 DIAGNOSIS — Z1231 Encounter for screening mammogram for malignant neoplasm of breast: Secondary | ICD-10-CM

## 2022-06-27 ENCOUNTER — Other Ambulatory Visit: Payer: Self-pay | Admitting: Physician Assistant

## 2022-06-29 NOTE — Telephone Encounter (Signed)
Please call to schedule an appt, due this month.  

## 2022-07-07 ENCOUNTER — Ambulatory Visit
Admission: RE | Admit: 2022-07-07 | Discharge: 2022-07-07 | Disposition: A | Discharge status: Home / Self Care | Payer: BC Managed Care – PPO | Source: Ambulatory Visit | Attending: Nurse Practitioner | Admitting: Nurse Practitioner

## 2022-07-07 DIAGNOSIS — Z1231 Encounter for screening mammogram for malignant neoplasm of breast: Secondary | ICD-10-CM | POA: Diagnosis present

## 2022-07-14 NOTE — Telephone Encounter (Signed)
LVM to schedule f/u

## 2022-08-08 ENCOUNTER — Ambulatory Visit
Admission: EM | Admit: 2022-08-08 | Discharge: 2022-08-08 | Disposition: A | Discharge status: Home / Self Care | Payer: BC Managed Care – PPO

## 2022-08-08 DIAGNOSIS — L720 Epidermal cyst: Secondary | ICD-10-CM

## 2022-08-08 NOTE — ED Provider Notes (Signed)
[facility]    CSN: [id number] Arrival date & time: 08/08/22  1357      History   Chief Complaint Chief Complaint  Patient presents with   Mass    back of head    HPI Kelsey Stephenson is a 57 y.o. female presenting for approximately 1 month history of a small bump to the back of her scalp.  She states her hairdresser informed her that the hair around the bump was missing and thought she should get it checked out.  Patient contacted her dermatologist, [facility] and has an appointment scheduled for October but she presents today for a evaluation to see if it has something that can wait until October to see dermatology.  The area is a little tender when it is touched.  There has not been any bleeding or drainage.  She denies any itching.  HPI  Past Medical History:  Diagnosis Date   Dysplastic nevus 07/02/2019   mid back spinal at [facility] - moderate   Family history of adverse reaction to anesthesia    Mother - PONV   GERD (gastroesophageal reflux disease)    Hypertension    Vertigo    Vertigo    last episode 2-3 mos ago    Patient Active Problem List   Diagnosis Date Noted   OCD (obsessive compulsive disorder) 12/16/2017   MDD (major depressive disorder) 12/16/2017   GAD (generalized anxiety disorder) 12/16/2017    Past Surgical History:  Procedure Laterality Date   ABDOMINAL HYSTERECTOMY     BILATERAL OOPHORECTOMY     CORRECTION OVERLAPPING TOES Left 12/06/2016   Procedure: CORRECTION OVERLAPPING TOES-2ND TOE;  Surgeon: Gwyneth Revels, DPM;  [facility];  Service: Podiatry;  Laterality: Left;   EXCISION MORTON'S NEUROMA Left 12/06/2016   Procedure: EXCISION MORTON'S NEUROMA;  Surgeon: Gwyneth Revels, DPM;  Location: [facility];  Service: Podiatry;  Laterality: Left;    OB History   No obstetric history on file.      Home Medications    Prior to Admission medications   Medication Sig Start Date End Date  Taking? Authorizing Provider  ALPRAZolam (XANAX) 0.5 MG tablet TAKE 1/2 TO 1 (ONE-HALF TO ONE) TABLET BY MOUTH TWICE DAILY AS NEEDED FOR ANXIETY 06/30/22  Yes Hurst, Teresa T, PA-C  buPROPion (WELLBUTRIN XL) 150 MG 24 hr tablet Take 1 tablet (150 mg total) by mouth every morning. 03/17/22  Yes Hurst, Rosey Bath T, PA-C  fluvoxaMINE (LUVOX) 100 MG tablet Take 1.5 tablets (150 mg total) by mouth at bedtime. 03/13/22  Yes Melony Overly T, PA-C  metoprolol succinate (TOPROL-XL) 50 MG 24 hr tablet Take 50 mg by mouth daily. Take with or immediately following a meal.   Yes [provider]  metoprolol tartrate (LOPRESSOR) 100 MG tablet Take tablet (100mg ) two hours prior to your cardiac CT scan. 05/02/22  Yes Debbe Odea, MD  Multiple Vitamin (MULTIVITAMIN) tablet Take 1 tablet by mouth daily.   Yes [provider]  Probiotic Product (PROBIOTIC PO) Take by mouth.   Yes [provider]  ivabradine (CORLANOR) 7.5 MG TABS tablet Take tablets (15mg ) TWO hours prior to your cardiac CT scan. 05/02/22   Debbe Odea, MD    Family History Family History  Problem Relation Age of Onset   Hypertension Father    Breast cancer Neg Hx     Social History Social History   Tobacco Use   Smoking status: Never   Smokeless tobacco: Never  Vaping  Use   Vaping Use: Never used  Substance Use Topics   Alcohol use: No   Drug use: No     Allergies   Patient has no known allergies.   Review of Systems Review of Systems  Constitutional:  Negative for fatigue and fever.  Skin:  Negative for color change and wound.       +cyst of scalp  Neurological:  Negative for headaches.     Physical Exam Triage Vital Signs ED Triage Vitals  Enc Vitals Group     BP 08/08/22 1407 (!) 152/93     Pulse Rate 08/08/22 1407 77     Resp 08/08/22 1407 16     Temp 08/08/22 1407 98.1 F (36.7 C)     Temp Source 08/08/22 1407 Oral     SpO2 08/08/22 1407 97 %     Weight 08/08/22 1405 210 lb  (95.3 kg)     Height 08/08/22 1405 5\' 7"  (1.702 m)     Head Circumference --      Peak Flow --      Pain Score 08/08/22 1405 0     Pain Loc --      Pain Edu? --      Excl. in GC? --    No data found.  Updated Vital Signs BP (!) 152/93 (BP Location: Right Arm)   Pulse 77   Temp 98.1 F (36.7 C) (Oral)   Resp 16   Ht 5\' 7"  (1.702 m)   Wt 210 lb (95.3 kg)   SpO2 97%   BMI 32.89 kg/m      Physical Exam Vitals and nursing note reviewed.  Constitutional:      General: She is not in acute distress.    Appearance: Normal appearance. She is not ill-appearing or toxic-appearing.  HENT:     Head: Normocephalic and atraumatic.  Eyes:     General: No scleral icterus.       Right eye: No discharge.        Left eye: No discharge.     Conjunctiva/sclera: Conjunctivae normal.  Cardiovascular:     Rate and Rhythm: Normal rate.  Pulmonary:     Effort: Pulmonary effort is normal. No respiratory distress.  Musculoskeletal:     Cervical back: Neck supple.  Skin:    General: Skin is dry.     Comments: Small nodule felt under skin of left occipital region with small overlying erythema. No bleeding, fluctuance or drainage.  Neurological:     General: No focal deficit present.     Mental Status: She is alert. Mental status is at baseline.     Motor: No weakness.     Gait: Gait normal.  Psychiatric:        Mood and Affect: Mood normal.        Behavior: Behavior normal.        Thought Content: Thought content normal.      UC Treatments / Results  Labs (all labs ordered are listed, but only abnormal results are displayed) Labs Reviewed - No data to display  EKG   Radiology No results found.  Procedures Procedures (including critical care time)  Medications Ordered in UC Medications - No data to display  Initial Impression / Assessment and Plan / UC Course  I have reviewed the triage vital signs and the nursing notes.  Pertinent labs & imaging results that were  available during my care of the patient were reviewed by me and considered in  my medical decision making (see chart for details).   57 year old female presents for bump under the skin of the scalp that she noticed about 4 weeks ago.  Hair is missing around it.  States it was initially more tender but the tenderness has gone down and the swelling has gone down a little.  Has an appointment with [facility] dermatology in October.  On exam she appears to have likely epidermal/sebaceous cyst.  It looks slightly inflamed but not infectious.  Advised warm compresses at this time and supportive care.  Advised to keep appointment with dermatology in October but to return sooner if symptoms worsen.   Final Clinical Impressions(s) / UC Diagnoses   Final diagnoses:  Epidermal inclusion cyst     Discharge Instructions      -The area of concern is most consistent with an epidermal or sebaceous cyst.  See the handout.  These can become inflamed and then the inflammation can go down. - Keep your appointment with the dermatologist.  If the area starts bothering you they may consider removing it but as discussed the entire capsule needs to be removed or it can become a recurrent issue. - If you feel that it enlarges, becomes more painful, begins to drain, please return here for re-evaluation if you are still waiting to see dermatology.       ED Prescriptions   None    PDMP not reviewed this encounter.   Shirlee Latch, PA-C 08/08/22 1435

## 2022-08-08 NOTE — ED Triage Notes (Signed)
Patient here today with concerns of a small bump on the back of her head that she noticed 4 weeks ago. It has not gotten bigger. No pain. It is tender from pressing on it. When she first noticed it, it was painful to lay on that side but not anymore.

## 2022-08-08 NOTE — Discharge Instructions (Signed)
-  The area of concern is most consistent with an epidermal or sebaceous cyst.  See the handout.  These can become inflamed and then the inflammation can go down. - Keep your appointment with the dermatologist.  If the area starts bothering you they may consider removing it but as discussed the entire capsule needs to be removed or it can become a recurrent issue. - If you feel that it enlarges, becomes more painful, begins to drain, please return here for re-evaluation if you are still waiting to see dermatology.

## 2022-09-06 ENCOUNTER — Ambulatory Visit (INDEPENDENT_AMBULATORY_CARE_PROVIDER_SITE_OTHER): Payer: BC Managed Care – PPO | Admitting: Dermatology

## 2022-09-06 VITALS — BP 127/83 | HR 77

## 2022-09-06 DIAGNOSIS — L729 Follicular cyst of the skin and subcutaneous tissue, unspecified: Secondary | ICD-10-CM

## 2022-09-06 DIAGNOSIS — L659 Nonscarring hair loss, unspecified: Secondary | ICD-10-CM | POA: Diagnosis not present

## 2022-09-06 DIAGNOSIS — L649 Androgenic alopecia, unspecified: Secondary | ICD-10-CM

## 2022-09-06 DIAGNOSIS — L650 Telogen effluvium: Secondary | ICD-10-CM

## 2022-09-06 MED ORDER — MINOXIDIL 2.5 MG PO TABS: 2.5000 mg | ORAL_TABLET | Freq: Every day | ORAL | 1 refills | Status: AC

## 2022-09-06 NOTE — Progress Notes (Signed)
Follow-Up Visit   Subjective  Kelsey Stephenson is a 57 y.o. female who presents for the following: The patient has spot possible cyst on her scalp to be evaluated, area on the scalp started out larger now smaller. Patient c/o hair loss x 3 years came up after Covid infection, treating with otc Divi with a poor response, patient report scalp sometimes itchy. No family h/o hair thinning/loss  The following portions of the chart were reviewed this encounter and updated as appropriate: medications, allergies, medical history  Review of Systems:  No other skin or systemic complaints except as noted in HPI or Assessment and Plan.  Objective  Well appearing patient in no apparent distress; mood and affect are within normal limits.  A focused examination was performed of the following areas:face,scalp   Relevant exam findings are noted in the Assessment and Plan.          Assessment & Plan    PROBABLE INFLAMED EPIDERMAL INCLUSION CYST- resolved Exam: slight induration with a slightly pink macule at the left occipital scalp   Benign-appearing. Exam most consistent with an epidermal inclusion cyst. Discussed that a cyst is a benign growth that can grow over time and sometimes get irritated or inflamed. Recommend observation if it is not bothersome. Discussed option of surgical excision to remove it if it is growing, symptomatic, or other changes noted. Please call for new or changing lesions so they can be evaluated.    Chronic TELOGEN EFFLUVIUM vrs androgenic alopecia Exam: Diffuse thinning of hair on scalp, scalp clear  Chronic and persistent condition with duration or expected duration over one year. Condition is symptomatic/ bothersome to patient. Not currently at goal.   Telogen effluvium is a benign, self-limited condition causing increased hair shedding usually for several months. It does not progress to baldness, and the hair eventually grows back on its own. It can be  triggered by recent illness, recent surgery, thyroid disease, low iron stores, vitamin D deficiency, fad diets or rapid weight loss, hormonal changes such as pregnancy or birth control pills, and some medication. Usually the hair loss starts 2-3 months after the illness or health change. Rarely, it can continue for longer than a year. Treatments options may include oral or topical Minoxidil; Red Light scalp treatments; Biotin 2.5 mg daily and other options.  Possibly related to Covid infection patient had around 3 years ago before hair loss started.  Could also be perimenopausal hair thinning  Labs reviewed from date 04/10/2022 at [facility]  TSH-normal, Vit D-low (pt taking supplements), nl Hb/Hct  Treatment Plan:  BP 127/83  Start Minoxidil 2.5 mg take 1/2 tablet x 1 month, if no side effects after 1 month increase to 1 tablet daily  Doses of minoxidil for hair loss are considered 'low dose'. This is because the doses used for hair loss are much lower than the doses which are used for conditions such as high blood pressure (hypertension). The doses used for hypertension are 10-40mg  per day.  Side effects are uncommon at the low doses (up to 2.5 mg/day) used to treat hair loss. Potential side effects, more commonly seen at higher doses, include: Increase in hair growth (hypertrichosis) elsewhere on face and body Temporary hair shedding upon starting medication which may last up to 4 weeks Ankle swelling, fluid retention, rapid weight gain more than 5 pounds Low blood pressure and feeling lightheaded or dizzy when standing up quickly Fast or irregular heartbeat Headaches   Return in about 6  months (around 03/09/2023) for hair loss and TBSE.  I, Angelique Holm, CMA, am acting as scribe for Willeen Niece, MD .   Documentation: I have reviewed the above documentation for accuracy and completeness, and I agree with the above.  Willeen Niece, MD

## 2022-09-06 NOTE — Patient Instructions (Addendum)
 Doses of minoxidil for hair loss are considered 'low dose'. This is because the doses used for hair loss are much lower than the doses which are used for conditions such as high blood pressure (hypertension). The doses used for hypertension are 10-'40mg'$  per day.  Side effects are uncommon at the low doses (up to 2.5 mg/day) used to treat hair loss. Potential side effects, more commonly seen at higher doses, include: Increase in hair growth (hypertrichosis) elsewhere on face and body Temporary hair shedding upon starting medication which may last up to 4 weeks Ankle swelling, fluid retention, rapid weight gain more than 5 pounds Low blood pressure and feeling lightheaded or dizzy when standing up quickly Fast or irregular heartbeat Headaches   Due to recent changes in healthcare laws, you may see results of your pathology and/or laboratory studies on MyChart before the doctors have had a chance to review them. We understand that in some cases there may be results that are confusing or concerning to you. Please understand that not all results are received at the same time and often the doctors may need to interpret multiple results in order to provide you with the best plan of care or course of treatment. Therefore, we ask that you please give [place] 2 business days to thoroughly review all your results before contacting the office for clarification. Should we see a critical lab result, you will be contacted sooner.   If You Need Anything After Your Visit  If you have any questions or concerns for your doctor, please call our main line at [phone] and press option 4 to reach your doctor's medical assistant. If no one answers, please leave a voicemail as directed and we will return your call as soon as possible. Messages left after 4 pm will be answered the following business day.   You may also send [place] a message via [place]. We typically respond to MyChart messages within 1-2 business days.  For  prescription refills, please ask your pharmacy to contact our office. Our fax number is [phone].  If you have an urgent issue when the clinic is closed that cannot wait until the next business day, you can page your doctor at the number below.    Please note that while we do our best to be available for urgent issues outside of office hours, we are not available 24/7.   If you have an urgent issue and are unable to reach [place], you may choose to seek medical care at your doctor's office, retail clinic, urgent care center, or emergency room.  If you have a medical emergency, please immediately call 911 or go to the emergency department.  Pager Numbers  - Dr. Nehemiah Massed: [phone]  - Dr. Laurence Ferrari: [phone]  - Dr. Nicole Kindred: [phone]  In the event of inclement weather, please call our main line at [phone] for an update on the status of any delays or closures.  Dermatology Medication Tips: Please keep the boxes that topical medications come in in order to help keep track of the instructions about where and how to use these. Pharmacies typically print the medication instructions only on the boxes and not directly on the medication tubes.   If your medication is too expensive, please contact our office at [phone] option 4 or send [place] a message through [place].   We are unable to tell what your co-pay for medications will be in advance as this is different depending on your insurance coverage. However, we may be able to  find a substitute medication at lower cost or fill out paperwork to get insurance to cover a needed medication.   If a prior authorization is required to get your medication covered by your insurance company, please allow [place] 1-2 business days to complete this process.  Drug prices often vary depending on where the prescription is filled and some pharmacies may offer cheaper prices.  The website www.goodrx.com contains coupons for medications through different  pharmacies. The prices here do not account for what the cost may be with help from insurance (it may be cheaper with your insurance), but the website can give you the price if you did not use any insurance.  - You can print the associated coupon and take it with your prescription to the pharmacy.  - You may also stop by our office during regular business hours and pick up a GoodRx coupon card.  - If you need your prescription sent electronically to a different pharmacy, notify our office through [facility] or by phone at [phone] option 4.     Si Usted Necesita Algo Despus de Su Visita  Tambin puede enviarnos un mensaje a travs de Pharmacist, community. Por lo general respondemos a los mensajes de MyChart en el transcurso de 1 a 2 das hbiles.  Para renovar recetas, por favor pida a su farmacia que se ponga en contacto con nuestra oficina. Harland Dingwall de fax es [place] [phone].  Si tiene un asunto urgente cuando la clnica est cerrada y que no puede esperar hasta el siguiente da hbil, puede llamar/localizar a su doctor(a) al nmero que aparece a continuacin.   Por favor, tenga en cuenta que aunque hacemos todo lo posible para estar disponibles para asuntos urgentes fuera del horario de [place], no estamos disponibles las 24 horas del da, los 7 das de la [place].   Si tiene un problema urgente y no puede comunicarse con nosotros, puede optar por buscar atencin mdica  en el consultorio de su doctor(a), en una clnica privada, en un centro de atencin urgente o en una sala de emergencias.  Si tiene Engineering geologist, por favor llame inmediatamente al 911 o vaya a la sala de emergencias.  Nmeros de bper  - Dr. Nehemiah Massed: [phone]  - Dra. Moye: [phone]  - Dra. Nicole Kindred: [phone]  En caso de inclemencias del [place], por favor llame a Johnsie Kindred principal al [phone] para una actualizacin sobre el [place] de cualquier retraso o cierre.  Consejos para la  medicacin en dermatologa: Por favor, guarde las cajas en las que vienen los medicamentos de uso tpico para ayudarle a seguir las instrucciones sobre dnde y cmo usarlos. Las farmacias generalmente imprimen las instrucciones del medicamento slo en las cajas y no directamente en los tubos del [place].   Si su medicamento es muy caro, por favor, pngase en contacto con Zigmund Daniel llamando al [phone] y presione la opcin 4 o envenos un mensaje a travs de Pharmacist, community.   No podemos decirle cul ser su copago por los medicamentos por adelantado ya que esto es diferente dependiendo de la cobertura de su seguro. Sin embargo, es posible que podamos encontrar un medicamento sustituto a Electrical engineer un formulario para que el seguro cubra el medicamento que se considera necesario.   Si se requiere una autorizacin previa para que su compaa de seguros [place] su medicamento, por favor permtanos de 1 a 2 das hbiles para completar este proceso.  Los precios de los medicamentos varan con frecuencia dependiendo  del lugar de dnde se surte la receta y alguna farmacias pueden ofrecer precios ms baratos.  El sitio web www.goodrx.com tiene cupones para medicamentos de Airline pilot. Los precios aqu no tienen en cuenta lo que podra costar con la ayuda del seguro (puede ser ms barato con su seguro), pero el sitio web puede darle el precio si no utiliz Research scientist (physical sciences).  - Puede imprimir el cupn correspondiente y llevarlo con su receta a la farmacia.  - Tambin puede pasar por nuestra oficina durante el horario de atencin regular y Charity fundraiser una tarjeta de cupones de GoodRx.  - Si necesita que su receta se enve electrnicamente a una farmacia diferente, informe a nuestra oficina a travs de MyChart de [place] o por telfono llamando al [phone] y presione la opcin 4.

## 2022-09-11 ENCOUNTER — Other Ambulatory Visit: Payer: Self-pay | Admitting: Physician Assistant

## 2022-10-11 ENCOUNTER — Encounter: Payer: Self-pay | Admitting: Physician Assistant

## 2022-10-11 ENCOUNTER — Telehealth: Payer: BC Managed Care – PPO | Admitting: Physician Assistant

## 2022-10-11 DIAGNOSIS — F4321 Adjustment disorder with depressed mood: Secondary | ICD-10-CM | POA: Diagnosis not present

## 2022-10-11 DIAGNOSIS — F411 Generalized anxiety disorder: Secondary | ICD-10-CM | POA: Diagnosis not present

## 2022-10-11 DIAGNOSIS — F429 Obsessive-compulsive disorder, unspecified: Secondary | ICD-10-CM | POA: Diagnosis not present

## 2022-10-11 DIAGNOSIS — F3341 Major depressive disorder, recurrent, in partial remission: Secondary | ICD-10-CM

## 2022-10-11 MED ORDER — BUPROPION HCL ER (XL) 300 MG PO TB24: 300.0000 mg | ORAL_TABLET | Freq: Every day | ORAL | 1 refills | Status: DC

## 2022-10-11 MED ORDER — ALPRAZOLAM 0.5 MG PO TABS
ORAL_TABLET | ORAL | 5 refills | Status: DC
Start: 2022-10-11 — End: 2023-07-19

## 2022-10-11 NOTE — Progress Notes (Addendum)
[facility]  Patient ID: Kelsey Stephenson,  MRN: [id number]  PCP: Myrene Buddy, NP  Date of Evaluation: 10/11/2022  Time spent:20 minutes  Chief Complaint:  Chief Complaint   Anxiety; Depression; Follow-up   Virtual Visit via Telehealth  I connected with patient by a video enabled telemedicine application with their informed consent, and verified patient privacy and that I am speaking with the correct person using two identifiers.  I am private, in my office and the patient is at home.  I discussed the limitations, risks, security and privacy concerns of performing an evaluation and management service by video and the availability of in person appointments. I also discussed with the patient that there may be a patient responsible charge related to this service. The patient expressed understanding and agreed to proceed.   I discussed the assessment and treatment plan with the patient. The patient was provided an opportunity to ask questions and all were answered. The patient agreed with the plan and demonstrated an understanding of the instructions.   The patient was advised to call back or seek an in-person evaluation if the symptoms worsen or if the condition fails to improve as anticipated.  I provided 20 minutes of non-face-to-face time during this encounter.  HISTORY/CURRENT STATUS: HPI For routine med check.  Kelsey Stephenson has been under a lot of stress lately and even for the past year really.  Her daughter was in a high risk pregnancy, she and the baby are just fine but that was a very stressful 9 months.  Then patient's dad died in 04/16/22.  He and her mom had been married for 60 years.  He just literally dropped dead while he and her mom were sitting talking.  She is glad he did not have to suffer or go to a nursing home for any reason.  Her parents are Christians and even though she misses him she is thankful he did not have to suffer and she knows where he is.   Her mom reconnected with her old high school sweetheart and will be moving to .  It is where she is from, she feels led to go back there.    Patient is not super depressed but she does not have the energy and motivation that she had even a year ago.  She asked if it is appropriate to increase the Wellbutrin.  She does not cry easily.  She is able to enjoy things.  Energy can be low at times.  Appetite is normal and weight is stable.  ADLs and personal hygiene are normal.  She feels anxious a lot, not having panic attacks but feels like she could on occasion.  She has been taking the Xanax more in the past few months than she has ever taken it in her life.  In general she feels wound up a lot of the time.  Denies suicidal or homicidal thoughts.  Patient denies increased energy with decreased need for sleep, increased talkativeness, racing thoughts, impulsivity or risky behaviors, increased spending, increased libido, grandiosity, increased irritability or anger, paranoia, or hallucinations.  Denies dizziness, syncope, seizures, numbness, tingling, tremor, tics, unsteady gait, slurred speech, confusion. Denies muscle or joint pain, stiffness, or dystonia.  Individual Medical History/ Review of Systems: Changes? :No     Past medications for mental health diagnoses include: Paxil, Prozac made her worse, BuSpar increased blood pressure, Celexa, Wellbutrin XL and SR, Xanax, Luvox  Allergies: Patient has no known allergies.  Current Medications:  Current  Outpatient Medications:    buPROPion (WELLBUTRIN XL) 300 MG 24 hr tablet, Take 1 tablet (300 mg total) by mouth daily., Disp: 30 tablet, Rfl: 1   fluvoxaMINE (LUVOX) 100 MG tablet, Take 1.5 tablets (150 mg total) by mouth at bedtime., Disp: 135 tablet, Rfl: 3   metoprolol succinate (TOPROL-XL) 50 MG 24 hr tablet, Take 50 mg by mouth daily. Take with or immediately following a meal., Disp: , Rfl:    Multiple Vitamin (MULTIVITAMIN) tablet,  Take 1 tablet by mouth daily., Disp: , Rfl:    Probiotic Product (PROBIOTIC PO), Take by mouth., Disp: , Rfl:    ALPRAZolam (XANAX) 0.5 MG tablet, TAKE 1/2 TO 1 (ONE-HALF TO ONE) TABLET BY MOUTH TWICE DAILY AS NEEDED FOR ANXIETY, Disp: 60 tablet, Rfl: 5   ivabradine (CORLANOR) 7.5 MG TABS tablet, Take tablets (15mg ) TWO hours prior to your cardiac CT scan., Disp: 2 tablet, Rfl: 0   metoprolol tartrate (LOPRESSOR) 100 MG tablet, Take tablet (100mg ) two hours prior to your cardiac CT scan. (Patient not taking: Reported on 10/11/2022), Disp: 1 tablet, Rfl: 0   minoxidil (LONITEN) 2.5 MG tablet, Take 1 tablet (2.5 mg total) by mouth daily. (Patient not taking: Reported on 10/11/2022), Disp: 90 tablet, Rfl: 1 Medication Side Effects: none  Family Medical/ Social History: Changes? Father died, he literally fell over dead while sitting and talking with her Mom. He didn't suffer. Dtr had a baby. Her mom has reconnected w/ her HS sweetheart and is moving to [place].  MENTAL HEALTH EXAM:  There were no vitals taken for this visit.There is no height or weight on file to calculate BMI.  General Appearance: Casual and Well Groomed  Eye Contact:  Good  Speech:  Clear and Coherent  Volume:  Normal  Mood:  Anxious  Affect:  Congruent  Thought Process:  Goal Directed and Descriptions of Associations: Circumstantial  Orientation:  Full (Time, Place, and Person)  Thought Content: Logical   Suicidal Thoughts:  No  Homicidal Thoughts:  No  Memory:  WNL  Judgement:  Good  Insight:  Good  Psychomotor Activity:  Normal  Concentration:  Concentration: Good  Recall:  Good  Fund of Knowledge: Good  Language: Good  Assets:  Desire for Improvement  ADL's:  Intact  Cognition: WNL  Prognosis:  Good   DIAGNOSES:    ICD-10-CM   1. Generalized anxiety disorder  F41.1     2. Grief  F43.21     3. Obsessive-compulsive disorder, unspecified type  F42.9     4. Recurrent major depression in partial remission (HCC)   F33.41       Receiving Psychotherapy: No   RECOMMENDATIONS:  PDMP was reviewed. Xanax filled 06/30/2022. I provided 20 minutes of non-face-to-face time during this encounter, including time spent before and after the visit in records review, medical decision making, counseling pertinent to today's visit, and charting.   My condolences in the loss of her father.  We discussed the way she is feeling mentally and physically. the way she is feeling mentally and physically.  Increasing the Wellbutrin is a good idea because it will give her more energy and motivation.  Sometimes that can be a little anxiety provoking however.  If that occurs and is intolerable, or lasts more than 1 to 2 weeks then she should let me know.  The other option would be to increase the Luvox, but it is usually not as motivating and in fact can cause more fatigue and some people  the higher the dose goes.  Continue Xanax 0.5 mg, 1/2-1 twice daily as needed. Increase Wellbutrin XL to 300 mg, 1 p.o. every morning.  Continue Luvox 100 mg, 1.5 pills  p.o. nightly. Continue healthy lifestyle choices. Return in 6 weeks.  Melony Overly, PA-C

## 2022-11-22 ENCOUNTER — Telehealth: Payer: BC Managed Care – PPO | Admitting: Physician Assistant

## 2022-11-22 ENCOUNTER — Encounter: Payer: Self-pay | Admitting: Physician Assistant

## 2022-11-22 DIAGNOSIS — F411 Generalized anxiety disorder: Secondary | ICD-10-CM | POA: Diagnosis not present

## 2022-11-22 DIAGNOSIS — F4321 Adjustment disorder with depressed mood: Secondary | ICD-10-CM

## 2022-11-22 DIAGNOSIS — F429 Obsessive-compulsive disorder, unspecified: Secondary | ICD-10-CM

## 2022-11-22 DIAGNOSIS — F3342 Major depressive disorder, recurrent, in full remission: Secondary | ICD-10-CM | POA: Diagnosis not present

## 2022-11-22 MED ORDER — BUPROPION HCL ER (XL) 300 MG PO TB24: 300.0000 mg | ORAL_TABLET | Freq: Every day | ORAL | 1 refills | Status: DC

## 2022-11-22 NOTE — Progress Notes (Signed)
[facility]  Patient ID: Kelsey Stephenson,  MRN: [id number]  PCP: Myrene Buddy, NP  Date of Evaluation: 11/22/2022 Time spent: 21 minutes  Chief Complaint:  Chief Complaint   Depression; Anxiety; Follow-up   Virtual Visit via Telehealth  I connected with patient by a video enabled telemedicine application with their informed consent, and verified patient privacy and that I am speaking with the correct person using two identifiers.  I am private, in my office and the patient is at home.  I discussed the limitations, risks, security and privacy concerns of performing an evaluation and management service by video and the availability of in person appointments. I also discussed with the patient that there may be a patient responsible charge related to this service. The patient expressed understanding and agreed to proceed.   I discussed the assessment and treatment plan with the patient. The patient was provided an opportunity to ask questions and all were answered. The patient agreed with the plan and demonstrated an understanding of the instructions.   The patient was advised to call back or seek an in-person evaluation if the symptoms worsen or if the condition fails to improve as anticipated.  I provided 21 minutes of non-face-to-face time during this encounter.  HISTORY/CURRENT STATUS: HPI For 6 week med check.  At the last visit we increased Wellbutrin.  States she is about 80 to 90% better.  She has more energy and motivation.  She is more able to enjoy things than she had been.  It has been a stressful year with the death of her dad in 05-10-2022 and her daughter's high risk pregnancy although she had a healthy little boy.  Patient and her husband are planning to travel some this fall and she is looking forward to that.  Appetite is normal and weight is stable.  ADLs and personal hygiene are normal.  She is able to focus and get things done in a timely manner.  No  decrease in memory.  She is sleeping well most of the time.  She still gets anxious at times and takes the Xanax which is helpful.  It is much better since the increase of Wellbutrin though.  No extreme obsessions.  No compulsions.  Denies suicidal or homicidal thoughts.  Patient denies increased energy with decreased need for sleep, increased talkativeness, racing thoughts, impulsivity or risky behaviors, increased spending, increased libido, grandiosity, increased irritability or anger, paranoia, or hallucinations.  Denies dizziness, syncope, seizures, numbness, tingling, tremor, tics, unsteady gait, slurred speech, confusion. Denies muscle or joint pain, stiffness, or dystonia.  Individual Medical History/ Review of Systems: Changes? :No   Past medications for mental health diagnoses include: Paxil, Prozac made her worse, BuSpar increased blood pressure, Celexa, Wellbutrin XL and SR, Xanax, Luvox  Allergies: Patient has no known allergies.  Current Medications:  Current Outpatient Medications:    ALPRAZolam (XANAX) 0.5 MG tablet, TAKE 1/2 TO 1 (ONE-HALF TO ONE) TABLET BY MOUTH TWICE DAILY AS NEEDED FOR ANXIETY, Disp: 60 tablet, Rfl: 5   fluvoxaMINE (LUVOX) 100 MG tablet, Take 1.5 tablets (150 mg total) by mouth at bedtime., Disp: 135 tablet, Rfl: 3   metoprolol succinate (TOPROL-XL) 50 MG 24 hr tablet, Take 50 mg by mouth daily. Take with or immediately following a meal., Disp: , Rfl:    Multiple Vitamin (MULTIVITAMIN) tablet, Take 1 tablet by mouth daily., Disp: , Rfl:    Probiotic Product (PROBIOTIC PO), Take by mouth. SEED, Disp: , Rfl:  buPROPion (WELLBUTRIN XL) 300 MG 24 hr tablet, Take 1 tablet (300 mg total) by mouth daily., Disp: 90 tablet, Rfl: 1   ivabradine (CORLANOR) 7.5 MG TABS tablet, Take tablets (15mg ) TWO hours prior to your cardiac CT scan., Disp: 2 tablet, Rfl: 0   metoprolol tartrate (LOPRESSOR) 100 MG tablet, Take tablet (100mg ) two hours prior to your cardiac CT scan.  (Patient not taking: Reported on 10/11/2022), Disp: 1 tablet, Rfl: 0   minoxidil (LONITEN) 2.5 MG tablet, Take 1 tablet (2.5 mg total) by mouth daily. (Patient not taking: Reported on 10/11/2022), Disp: 90 tablet, Rfl: 1 Medication Side Effects: none  Family Medical/ Social History: Changes? No  MENTAL HEALTH EXAM:  There were no vitals taken for this visit.There is no height or weight on file to calculate BMI.  General Appearance: Casual and Well Groomed  Eye Contact:  Good  Speech:  Clear and Coherent and Normal Rate  Volume:  Normal  Mood:  Euthymic  Affect:  Congruent  Thought Process:  Goal Directed and Descriptions of Associations: Circumstantial  Orientation:  Full (Time, Place, and Person)  Thought Content: Logical   Suicidal Thoughts:  No  Homicidal Thoughts:  No  Memory:  WNL  Judgement:  Good  Insight:  Good  Psychomotor Activity:  Normal  Concentration:  Concentration: Good  Recall:  Good  Fund of Knowledge: Good  Language: Good  Assets:  Desire for Improvement Financial Resources/Insurance Housing Transportation Vocational/Educational  ADL's:  Intact  Cognition: WNL  Prognosis:  Good   DIAGNOSES:    ICD-10-CM   1. Generalized anxiety disorder  F41.1     2. Recurrent major depressive disorder, in full remission (HCC)  F33.42     3. Obsessive-compulsive disorder, unspecified type  F42.9     4. Grief  F43.21      Receiving Psychotherapy: No   RECOMMENDATIONS:  PDMP reviewed.  Xanax filled 10/11/2022. I provided 21 minutes of non-face-to-face time during this encounter, including time spent before and after the visit in records review, medical decision making, counseling pertinent to today's visit, and charting.   I am glad to see her doing so much better!  No change in treatment needed.  Continue Xanax 0.5 mg, 1/2-1 p.o. twice daily as needed anxiety. Continue Wellbutrin XL 300 mg, 1 p.o. every morning. Continue vitamins along with healthy lifestyle of  exercising at least 150 minutes/week, healthy diet. Recommend counseling as needed. Return in 6 months.  Melony Overly, PA-C

## 2022-12-05 ENCOUNTER — Other Ambulatory Visit: Payer: Self-pay | Admitting: Physician Assistant

## 2023-03-20 ENCOUNTER — Ambulatory Visit: Payer: BC Managed Care – PPO | Admitting: Dermatology

## 2023-04-13 ENCOUNTER — Other Ambulatory Visit: Payer: Self-pay

## 2023-04-13 MED ORDER — FLUVOXAMINE MALEATE 100 MG PO TABS: 150.0000 mg | ORAL_TABLET | Freq: Every day | ORAL | 3 refills | Status: DC

## 2023-05-03 ENCOUNTER — Other Ambulatory Visit: Payer: Self-pay | Admitting: Physician Assistant

## 2023-05-23 ENCOUNTER — Telehealth: Payer: BC Managed Care – PPO | Admitting: Physician Assistant

## 2023-07-19 ENCOUNTER — Encounter: Payer: Self-pay | Admitting: Physician Assistant

## 2023-07-19 ENCOUNTER — Telehealth: Admitting: Physician Assistant

## 2023-07-19 DIAGNOSIS — F429 Obsessive-compulsive disorder, unspecified: Secondary | ICD-10-CM

## 2023-07-19 DIAGNOSIS — F411 Generalized anxiety disorder: Secondary | ICD-10-CM | POA: Diagnosis not present

## 2023-07-19 DIAGNOSIS — F3342 Major depressive disorder, recurrent, in full remission: Secondary | ICD-10-CM

## 2023-07-19 MED ORDER — ALPRAZOLAM 0.5 MG PO TABS: ORAL_TABLET | ORAL | 5 refills | Status: DC

## 2023-07-19 NOTE — Progress Notes (Signed)
 [facility]  Patient ID: Kelsey Stephenson,  MRN: [id number]  PCP: Deliah Fells, NP  Date of Evaluation: 07/19/2023 Time spent:20 minutes  Chief Complaint:  Chief Complaint   Anxiety; Depression; Follow-up    Virtual Visit via Telehealth  I connected with patient by a video enabled telemedicine application with their informed consent, and verified patient privacy and that I am speaking with the correct person using two identifiers.  I am private, in my office and the patient is at home.  I discussed the limitations, risks, security and privacy concerns of performing an evaluation and management service by video and the availability of in person appointments. I also discussed with the patient that there may be a patient responsible charge related to this service. The patient expressed understanding and agreed to proceed.   I discussed the assessment and treatment plan with the patient. The patient was provided an opportunity to ask questions and all were answered. The patient agreed with the plan and demonstrated an understanding of the instructions.   The patient was advised to call back or seek an in-person evaluation if the symptoms worsen or if the condition fails to improve as anticipated.  I provided 20 minutes of non-face-to-face time during this encounter.  HISTORY/CURRENT STATUS: HPI For routine med check.  She is doing really well.  Feels that her medications are working as they should.  Patient is able to enjoy things.  She and her husband are taking their kids and grandkids to [place], leaving tomorrow and will be there a week.  She is excited about that.  Energy and motivation are good.  No extreme sadness, tearfulness, or feelings of hopelessness.  Sleeps well most of the time. ADLs and personal hygiene are normal.   Denies any changes in concentration, making decisions, or remembering things.  Appetite has not changed.  Weight is stable.   Denies  suicidal or homicidal thoughts.  She has been having PVCs and was worked up with heart monitor.  No reason was found.  She has been on metoprolol  for a while due to hypertension.  Her cardiologist did increase it to see if that would help with the PVCs but it did not so the dose was changed back to the original dose given for hypertension.  States she goes into a fight or flight mode when she starts feeling the palpitations which makes her very nervous.  She can obsess about it if she does not put her mind on something else.  She has been taking the Xanax  more often because of that.  It is effective when needed.  States she does not feel stressed which she understands can cause the palpitations.  Patient denies increased energy with decreased need for sleep, increased talkativeness, racing thoughts, impulsivity or risky behaviors, increased spending, increased libido, grandiosity, increased irritability or anger, paranoia, or hallucinations.  Denies dizziness, syncope, seizures, numbness, tingling, tremor, tics, unsteady gait, slurred speech, confusion. Denies muscle or joint pain, stiffness, or dystonia.  Individual Medical History/ Review of Systems: Changes? :Yes see HPI  Past medications for mental health diagnoses include: Paxil, Prozac made her worse, BuSpar increased blood pressure, Celexa, Wellbutrin  XL and SR, Xanax , Luvox   Allergies: Patient has no known allergies.  Current Medications:  Current Outpatient Medications:    buPROPion  (WELLBUTRIN  XL) 300 MG 24 hr tablet, TAKE 1 TABLET DAILY, Disp: 90 tablet, Rfl: 1   fluvoxaMINE  (LUVOX ) 100 MG tablet, Take 1.5 tablets (150 mg total) by mouth at bedtime.,  Disp: 135 tablet, Rfl: 3   metoprolol  succinate (TOPROL -XL) 50 MG 24 hr tablet, Take 50 mg by mouth daily. Take with or immediately following a meal., Disp: , Rfl:    Multiple Vitamin (MULTIVITAMIN) tablet, Take 1 tablet by mouth daily., Disp: , Rfl:    Probiotic Product (PROBIOTIC PO),  Take by mouth. SEED, Disp: , Rfl:    ALPRAZolam  (XANAX ) 0.5 MG tablet, TAKE 1/2 TO 1 (ONE-HALF TO ONE) TABLET BY MOUTH TWICE DAILY AS NEEDED FOR ANXIETY, Disp: 60 tablet, Rfl: 5   ivabradine  (CORLANOR) 7.5 MG TABS tablet, Take tablets (15mg ) TWO hours prior to your cardiac CT scan., Disp: 2 tablet, Rfl: 0   metoprolol  tartrate (LOPRESSOR ) 100 MG tablet, Take tablet (100mg ) two hours prior to your cardiac CT scan. (Patient not taking: Reported on 07/19/2023), Disp: 1 tablet, Rfl: 0   minoxidil  (LONITEN ) 2.5 MG tablet, Take 1 tablet (2.5 mg total) by mouth daily. (Patient not taking: Reported on 07/19/2023), Disp: 90 tablet, Rfl: 1 Medication Side Effects: none  Family Medical/ Social History: Changes? No  MENTAL HEALTH EXAM:  There were no vitals taken for this visit.There is no height or weight on file to calculate BMI.  General Appearance: Casual and Well Groomed  Eye Contact:  Good  Speech:  Clear and Coherent and Normal Rate  Volume:  Normal  Mood:  Euthymic  Affect:  Congruent  Thought Process:  Goal Directed and Descriptions of Associations: Circumstantial  Orientation:  Full (Time, Place, and Person)  Thought Content: Logical   Suicidal Thoughts:  No  Homicidal Thoughts:  No  Memory:  WNL  Judgement:  Good  Insight:  Good  Psychomotor Activity:  Normal  Concentration:  Concentration: Good  Recall:  Good  Fund of Knowledge: Good  Language: Good  Assets:  Communication Skills Desire for Improvement Financial Resources/Insurance Housing Transportation Vocational/Educational  ADL's:  Intact  Cognition: WNL  Prognosis:  Good   DIAGNOSES:    ICD-10-CM   1. Generalized anxiety disorder  F41.1     2. Recurrent major depressive disorder, in full remission (HCC)  F33.42     3. Obsessive-compulsive disorder, unspecified type  F42.9       Receiving Psychotherapy: No   RECOMMENDATIONS:  PDMP reviewed.  Xanax  filled 03/30/2023. I provided 20 minutes of non-face-to-face time  during this encounter, including time spent before and after the visit in records review, medical decision making, counseling pertinent to today's visit, and charting.   She is doing well on current treatment so no changes will be made.  Continue Xanax  0.5 mg, 1/2-1 p.o. twice daily as needed anxiety. Continue Wellbutrin  XL 300 mg, 1 p.o. every morning. Continue Luvox  100 mg, 1.5 pills nightly. Continue vitamins as per med list. Return in 6 months.  Marvia Slocumb, PA-C

## 2023-07-23 ENCOUNTER — Telehealth: Payer: Self-pay | Admitting: Physician Assistant

## 2023-07-23 NOTE — Telephone Encounter (Signed)
 Letter written and put in Teresa's box to sign.
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
## 2023-07-25 NOTE — Telephone Encounter (Signed)
 Letter emailed

## 2023-08-01 ENCOUNTER — Other Ambulatory Visit: Payer: Self-pay | Admitting: Emergency Medicine

## 2023-08-01 DIAGNOSIS — R1011 Right upper quadrant pain: Secondary | ICD-10-CM

## 2023-08-02 ENCOUNTER — Ambulatory Visit
Admission: RE | Admit: 2023-08-02 | Discharge: 2023-08-02 | Disposition: A | Discharge status: Home / Self Care | Source: Ambulatory Visit | Attending: Emergency Medicine | Admitting: Emergency Medicine

## 2023-08-02 DIAGNOSIS — R1011 Right upper quadrant pain: Secondary | ICD-10-CM

## 2023-10-30 ENCOUNTER — Other Ambulatory Visit: Payer: Self-pay | Admitting: Physician Assistant

## 2024-01-28 ENCOUNTER — Other Ambulatory Visit: Payer: Self-pay | Admitting: Physician Assistant

## 2024-01-28 NOTE — Telephone Encounter (Signed)
Please call to schedule FU, past due

## 2024-01-29 NOTE — Telephone Encounter (Signed)
 Lvm for pt to call and schedule

## 2024-03-11 ENCOUNTER — Encounter: Payer: Self-pay | Admitting: Physician Assistant

## 2024-03-11 ENCOUNTER — Telehealth: Admitting: Physician Assistant

## 2024-03-11 DIAGNOSIS — F3342 Major depressive disorder, recurrent, in full remission: Secondary | ICD-10-CM

## 2024-03-11 DIAGNOSIS — F411 Generalized anxiety disorder: Secondary | ICD-10-CM

## 2024-03-11 MED ORDER — FLUVOXAMINE MALEATE 100 MG PO TABS: 150.0000 mg | ORAL_TABLET | Freq: Every day | ORAL | 3 refills | Status: AC

## 2024-03-11 MED ORDER — ALPRAZOLAM 0.5 MG PO TABS: ORAL_TABLET | ORAL | 5 refills | Status: AC

## 2024-03-11 MED ORDER — BUPROPION HCL ER (XL) 300 MG PO TB24: 300.0000 mg | ORAL_TABLET | Freq: Every day | ORAL | 3 refills | Status: AC

## 2024-03-11 NOTE — Progress Notes (Signed)
 "     [facility]  Patient ID: Kelsey Stephenson,  MRN: [id number]  PCP: Don Lauraine Collar, NP  Date of Evaluation: 03/11/2024 Time spent:20 minutes  Chief Complaint:  Chief Complaint   Anxiety; Depression; Follow-up    Virtual Visit via Telehealth  I connected with patient by a video enabled telemedicine application with their informed consent, and verified patient privacy and that I am speaking with the correct person using two identifiers.  I am private, in my office and the patient is at home.  I discussed the limitations, risks, security and privacy concerns of performing an evaluation and management service by video and the availability of in person appointments. I also discussed with the patient that there may be a patient responsible charge related to this service. The patient expressed understanding and agreed to proceed.   I discussed the assessment and treatment plan with the patient. The patient was provided an opportunity to ask questions and all were answered. The patient agreed with the plan and demonstrated an understanding of the instructions.   The patient was advised to call back or seek an in-person evaluation if the symptoms worsen or if the condition fails to improve as anticipated.  I provided 20 minutes of non-face-to-face time during this encounter.  HISTORY/CURRENT STATUS: HPI For routine med check.  Doing well. She is able to enjoy things.  Energy and motivation are good.  She's not working by choice.   Enjoys doing things around the house. Spending time with her kids and grandkids.  Traveling with her husband.  No extreme sadness, tearfulness, or feelings of hopelessness.  Sleeps well most of the time. ADLs and personal hygiene are normal.   Denies any changes in concentration, making decisions, or remembering things.  Appetite has not changed.  Weight is stable. Anxiety is well-controlled.  She takes Xanax  prn and it is effective.  No mania,  delirium, AH/VH.  No SI/HI.  Individual Medical History/ Review of Systems: Changes? :No    Past medications for mental health diagnoses include: Paxil, Prozac made her worse, BuSpar increased blood pressure, Celexa, Wellbutrin  XL and SR, Xanax , Luvox   Allergies: Patient has no known allergies.  Current Medications:  Current Outpatient Medications:    estradiol (CLIMARA - DOSED IN MG/24 HR) 0.025 mg/24hr patch, 0.025 mg once a week., Disp: , Rfl:    metoprolol  succinate (TOPROL -XL) 50 MG 24 hr tablet, Take 50 mg by mouth daily. Take with or immediately following a meal., Disp: , Rfl:    Multiple Vitamin (MULTIVITAMIN) tablet, Take 1 tablet by mouth daily., Disp: , Rfl:    Probiotic Product (PROBIOTIC PO), Take by mouth. SEED, Disp: , Rfl:    progesterone (PROMETRIUM) 100 MG capsule, Take 100 mg by mouth at bedtime., Disp: , Rfl:    ALPRAZolam  (XANAX ) 0.5 MG tablet, TAKE 1/2 TO 1 (ONE-HALF TO ONE) TABLET BY MOUTH TWICE DAILY AS NEEDED FOR ANXIETY, Disp: 60 tablet, Rfl: 5   buPROPion  (WELLBUTRIN  XL) 300 MG 24 hr tablet, Take 1 tablet (300 mg total) by mouth daily., Disp: 90 tablet, Rfl: 3   fluvoxaMINE  (LUVOX ) 100 MG tablet, Take 1.5 tablets (150 mg total) by mouth at bedtime., Disp: 135 tablet, Rfl: 3   ivabradine  (CORLANOR) 7.5 MG TABS tablet, Take tablets (15mg ) TWO hours prior to your cardiac CT scan., Disp: 2 tablet, Rfl: 0   metoprolol  tartrate (LOPRESSOR ) 100 MG tablet, Take tablet (100mg ) two hours prior to your cardiac CT scan. (Patient not taking:  Reported on 07/19/2023), Disp: 1 tablet, Rfl: 0   minoxidil  (LONITEN ) 2.5 MG tablet, Take 1 tablet (2.5 mg total) by mouth daily. (Patient not taking: Reported on 07/19/2023), Disp: 90 tablet, Rfl: 1 Medication Side Effects: none  Family Medical/ Social History: Changes? No  MENTAL HEALTH EXAM:  There were no vitals taken for this visit.There is no height or weight on file to calculate BMI.  General Appearance: Casual and Well Groomed   Eye Contact:  Good  Speech:  Clear and Coherent and Normal Rate  Volume:  Normal  Mood:  Euthymic  Affect:  Congruent  Thought Process:  Goal Directed and Descriptions of Associations: Circumstantial  Orientation:  Full (Time, Place, and Person)  Thought Content: Logical   Suicidal Thoughts:  No  Homicidal Thoughts:  No  Memory:  WNL  Judgement:  Good  Insight:  Good  Psychomotor Activity:  Normal  Concentration:  Concentration: Good  Recall:  Good  Fund of Knowledge: Good  Language: Good  Assets:  Communication Skills Desire for Improvement Financial Resources/Insurance Housing Leisure Time Resilience Transportation Vocational/Educational  ADL's:  Intact  Cognition: WNL  Prognosis:  Good   DIAGNOSES:    ICD-10-CM   1. Generalized anxiety disorder  F41.1     2. Recurrent major depressive disorder, in full remission  F33.42      Receiving Psychotherapy: No   RECOMMENDATIONS:  PDMP reviewed.  Xanax  filled 01/14/2024. I provided approximately  20 minutes of non-face-to-face time during this encounter, including time spent before and after the visit in records review, medical decision making, counseling pertinent to today's visit, and charting.   She is doing well so no changes need to be made.  Continue Xanax  0.5 mg, 1/2-1 p.o. twice daily as needed anxiety. Continue Wellbutrin  XL 300 mg, 1 p.o. every morning. Continue Luvox  100 mg, 1.5 pills nightly. Continue vitamins as per med list. Return in 6-8 months.  Verneita Cooks, PA-C  "
